# Patient Record
Sex: Female | Born: 1965 | Race: White | Hispanic: No | Marital: Married | Smoking: Never smoker
Health system: Southern US, Community
[De-identification: ages and names within clinical notes are randomized; demographics above are authoritative.]

## PROBLEM LIST (undated history)

## (undated) DIAGNOSIS — F32A Depression, unspecified: Secondary | ICD-10-CM

## (undated) DIAGNOSIS — Z9884 Bariatric surgery status: Secondary | ICD-10-CM

## (undated) DIAGNOSIS — Z87442 Personal history of urinary calculi: Secondary | ICD-10-CM

## (undated) DIAGNOSIS — D649 Anemia, unspecified: Secondary | ICD-10-CM

## (undated) DIAGNOSIS — K219 Gastro-esophageal reflux disease without esophagitis: Secondary | ICD-10-CM

## (undated) DIAGNOSIS — G4730 Sleep apnea, unspecified: Secondary | ICD-10-CM

## (undated) DIAGNOSIS — E119 Type 2 diabetes mellitus without complications: Secondary | ICD-10-CM

## (undated) HISTORY — PX: CARDIAC CATHETERIZATION: SHX172

## (undated) HISTORY — PX: WISDOM TOOTH EXTRACTION: SHX21

## (undated) HISTORY — PX: LIVER BIOPSY: SHX301

## (undated) HISTORY — PX: GASTRIC BYPASS: SHX52

## (undated) HISTORY — PX: BREAST BIOPSY: SHX20

## (undated) HISTORY — PX: CHOLECYSTECTOMY: SHX55

## (undated) HISTORY — DX: Sleep apnea, unspecified: G47.30

---

## 2000-08-07 ENCOUNTER — Other Ambulatory Visit: Admission: RE | Admit: 2000-08-07 | Discharge: 2000-08-07 | Payer: Self-pay | Admitting: Family Medicine

## 2003-05-20 ENCOUNTER — Ambulatory Visit (HOSPITAL_COMMUNITY): Admission: RE | Admit: 2003-05-20 | Discharge: 2003-05-20 | Payer: Self-pay | Admitting: Family Medicine

## 2004-02-04 ENCOUNTER — Other Ambulatory Visit: Admission: RE | Admit: 2004-02-04 | Discharge: 2004-02-04 | Payer: Self-pay | Admitting: Family Medicine

## 2005-03-30 ENCOUNTER — Other Ambulatory Visit: Admission: RE | Admit: 2005-03-30 | Discharge: 2005-03-30 | Payer: Self-pay | Admitting: Family Medicine

## 2007-04-11 ENCOUNTER — Ambulatory Visit (HOSPITAL_BASED_OUTPATIENT_CLINIC_OR_DEPARTMENT_OTHER): Admission: RE | Admit: 2007-04-11 | Discharge: 2007-04-11 | Payer: Self-pay | Admitting: Physician Assistant

## 2007-04-15 ENCOUNTER — Ambulatory Visit: Payer: Self-pay | Admitting: Internal Medicine

## 2007-06-04 ENCOUNTER — Ambulatory Visit (HOSPITAL_BASED_OUTPATIENT_CLINIC_OR_DEPARTMENT_OTHER): Admission: RE | Admit: 2007-06-04 | Discharge: 2007-06-04 | Payer: Self-pay | Admitting: Physician Assistant

## 2007-06-08 ENCOUNTER — Ambulatory Visit: Payer: Self-pay | Admitting: Internal Medicine

## 2009-04-25 ENCOUNTER — Emergency Department (HOSPITAL_COMMUNITY): Admission: EM | Admit: 2009-04-25 | Discharge: 2009-04-25 | Payer: Self-pay | Admitting: Emergency Medicine

## 2010-10-19 LAB — URINE CULTURE: Colony Count: 45000

## 2010-10-19 LAB — COMPREHENSIVE METABOLIC PANEL
ALT: 31 U/L (ref 0–35)
AST: 33 U/L (ref 0–37)
Albumin: 3.3 g/dL — ABNORMAL LOW (ref 3.5–5.2)
Alkaline Phosphatase: 89 U/L (ref 39–117)
BUN: 10 mg/dL (ref 6–23)
CO2: 20 mEq/L (ref 19–32)
Calcium: 8.7 mg/dL (ref 8.4–10.5)
Chloride: 105 mEq/L (ref 96–112)
Creatinine, Ser: 0.83 mg/dL (ref 0.4–1.2)
GFR calc Af Amer: >60 mL/min (ref >60)
GFR calc non Af Amer: >60 mL/min (ref >60)
Glucose, Bld: 169 mg/dL — ABNORMAL HIGH (ref 70–99)
Potassium: 3.1 mEq/L — ABNORMAL LOW (ref 3.5–5.1)
Sodium: 138 mEq/L (ref 135–145)
Total Bilirubin: 0.9 mg/dL (ref 0.3–1.2)
Total Protein: 6.9 g/dL (ref 6.0–8.3)

## 2010-10-19 LAB — URINALYSIS, ROUTINE W REFLEX MICROSCOPIC
Bilirubin Urine: NEGATIVE
Glucose, UA: 100 mg/dL — AB
Ketones, ur: >80 mg/dL — AB
Leukocytes, UA: NEGATIVE
Nitrite: NEGATIVE
Protein, ur: 30 mg/dL — AB
Specific Gravity, Urine: 1.031 — ABNORMAL HIGH (ref 1.005–1.030)
Urobilinogen, UA: 1 mg/dL (ref 0.0–1.0)
pH: 6 (ref 5.0–8.0)

## 2010-10-19 LAB — LIPASE, BLOOD: Lipase: 45 U/L (ref 11–59)

## 2010-10-19 LAB — URINE MICROSCOPIC-ADD ON

## 2010-10-19 LAB — DIFFERENTIAL
Basophils Absolute: 0 10*3/uL (ref 0.0–0.1)
Basophils Relative: 0 % (ref 0–1)
Eosinophils Absolute: 0 10*3/uL (ref 0.0–0.7)
Eosinophils Relative: 1 % (ref 0–5)
Lymphocytes Relative: 15 % (ref 12–46)
Lymphs Abs: 1 10*3/uL (ref 0.7–4.0)
Monocytes Absolute: 0.4 10*3/uL (ref 0.1–1.0)
Monocytes Relative: 6 % (ref 3–12)
Neutro Abs: 5.1 10*3/uL (ref 1.7–7.7)
Neutrophils Relative %: 79 % — ABNORMAL HIGH (ref 43–77)

## 2010-10-19 LAB — CBC
HCT: 36.5 % (ref 36.0–46.0)
Hemoglobin: 12.4 g/dL (ref 12.0–15.0)
MCHC: 34.1 g/dL (ref 30.0–36.0)
MCV: 86.9 fL (ref 78.0–100.0)
Platelets: 130 10*3/uL — ABNORMAL LOW (ref 150–400)
RBC: 4.2 MIL/uL (ref 3.87–5.11)
RDW: 14.8 % (ref 11.5–15.5)
WBC: 6.5 10*3/uL (ref 4.0–10.5)

## 2010-11-28 NOTE — Procedures (Signed)
NAME:  Jessica Bridges, Jessica Bridges                    ACCOUNT NO.:  [id number]   MEDICAL RECORD NO.:  [id number]          PATIENT TYPE:  OUT   LOCATION:  SLEEP CENTER                 FACILITY:  [facility]   PHYSICIAN:  Clinton D. Maple Hudson, MD, FCCP, FACPDATE OF BIRTH:  04/27/1967   DATE OF STUDY:  04/11/2007                            NOCTURNAL POLYSOMNOGRAM   REFERRING PHYSICIAN:   REFERRING PHYSICIAN:  Dr. Lindaann Pascal.   INDICATION FOR STUDY:  Insomnia with sleep apnea.   EPWORTH SLEEPINESS SCORE:  12/24.  BMI 26.5.  Weight 230 pounds.   MEDICATIONS:  Home medications are listed and reviewed.   SLEEP ARCHITECTURE:  Total sleep time 343 minutes with sleep efficiency  87%.  Stage I was 6%.  Stage II 69%.  Stage III 10%.  REM 15% of total  sleep time.  Sleep latency 24 minutes.  REM latency 154 minutes.  Awake  after sleep onset 29 minutes.  Arousal index 18.2.  No bedtime  medication was reported.   RESPIRATORY DATA:  Apnea-hypopnea index (AHI/RDI) 19.2 obstructive  events per hour, indicating moderate obstructive sleep apnea/hypopnea  syndrome.  This included one central apnea, eight obstructive apneas,  and 101 hypopneas.  Events were somewhat more common while supine but  seen in all sleep positions.  REM AHI 58.3.   OXYGEN DATA:  Moderately loud snoring with oxygen desaturation.  Nadir  85%.  Mean oxygen saturation through the study 97% on room air.   CARDIAC DATA:  Normal sinus rhythm.   MOVEMENT-PARASOMNIA:  No significant movement disturbance.  No bathroom  trips.   IMPRESSIONS-RECOMMENDATIONS:  1. Apnea-hypopnea index (AHI/RDI) 19.2 obstructive events per hour,      indicating moderate obstructive sleep apnea/hypopnea syndrome.  The      events were not significantly positional.  Moderately loud snoring      with      oxygen desaturation nadir 85% on room air.  2. Consider return for CPAP titration or evaluate for alternative      therapies as appropriate.      Clinton D. Maple Hudson, MD,  FCCP, FACP  Diplomate, Biomedical engineer of Sleep Medicine  Electronically Signed     CDY/MEDQ  D:  04/15/2007 18:55:15  T:  04/16/2007 10:39:35  Job:  [id number]

## 2010-11-28 NOTE — Procedures (Signed)
NAME:  Lawlor, Natajah                    ACCOUNT NO.:  [id number]   MEDICAL RECORD NO.:  [id number]          PATIENT TYPE:  OUT   LOCATION:  SLEEP CENTER                 FACILITY:  [facility]   PHYSICIAN:  Clinton D. Maple Hudson, MD, FCCP, FACPDATE OF BIRTH:  13[birth year]Sep-1966   DATE OF STUDY:  06/04/2007                            NOCTURNAL POLYSOMNOGRAM   REFERRING PHYSICIAN:  Scott Long, Dr.   [place] FOR STUDY:  Hypersomnia with sleep apnea.   EPWORTH SLEEPINESS SCORE:  12/24.  BMI 36.6.  Weight 227 pounds, 66  inches.   HOME MEDICATIONS:  Listed and reviewed.   SLEEP ARCHITECTURE:  Total sleep time 377 minutes with sleep efficiency  93%.  Stage I was 2%, stage II 83%, stage III 9%, REM reduced at 5.2%.  Sleep latency 1.5 minutes.  REM latency 368 minutes.  Awake after sleep  onset 26 minutes.  Arousal index 9.2.   BEDTIME MEDICATION:  Included bupropion, cetirizine, glipizide, Actos,  hormones, clonazepam.   RESPIRATORY DATA:  CPAP titration protocol.  CPAP was titrated to 8-CWP,  AHI 0 per hour.  A small Mirage Quattro mask was used with heated  humidifier.   OXYGEN DATA:  Oxygen desaturated to 85% with moderate snoring., after  CPAP saturation held 96% on room air.   CARDIAC DATA:  Normal sinus rhythm.   MOVEMENT-PARASOMNIA:  No significant movement disturbance.   IMPRESSIONS-RECOMMENDATIONS:  1. Successful CPAP titration to 8-CWP.  Apnea/hypopnea index zero per      hour.  A small Mirage Quattro mask was used with heated humidifier.  2. Baseline diagnostic nocturnal polysomnogram on April 11, 2007,      had recorded an apnea/hypopnea index of 19.2.      Clinton D. Maple Hudson, MD, [facility], FACP  Diplomate, Biomedical engineer of Sleep Medicine  Electronically Signed     CDY/MEDQ  D:  06/08/2007 14:07:02  T:  06/08/2007 19:43:26  Job:  [id number]

## 2012-05-22 ENCOUNTER — Emergency Department (HOSPITAL_COMMUNITY)

## 2012-05-22 ENCOUNTER — Encounter (HOSPITAL_COMMUNITY): Payer: Self-pay | Admitting: Emergency Medicine

## 2012-05-22 ENCOUNTER — Emergency Department (HOSPITAL_COMMUNITY)
Admission: EM | Admit: 2012-05-22 | Discharge: 2012-05-22 | Disposition: A | Discharge status: Home / Self Care | Attending: Emergency Medicine | Admitting: Emergency Medicine

## 2012-05-22 DIAGNOSIS — M25519 Pain in unspecified shoulder: Secondary | ICD-10-CM

## 2012-05-22 DIAGNOSIS — E119 Type 2 diabetes mellitus without complications: Secondary | ICD-10-CM | POA: Insufficient documentation

## 2012-05-22 DIAGNOSIS — R0602 Shortness of breath: Secondary | ICD-10-CM | POA: Insufficient documentation

## 2012-05-22 HISTORY — DX: Type 2 diabetes mellitus without complications: E11.9

## 2012-05-22 LAB — GLUCOSE, CAPILLARY
Glucose-Capillary: 67 mg/dL — ABNORMAL LOW (ref 70–99)
Glucose-Capillary: 106 mg/dL — ABNORMAL HIGH (ref 70–99)
Glucose-Capillary: 107 mg/dL — ABNORMAL HIGH (ref 70–99)

## 2012-05-22 LAB — CBC WITH DIFFERENTIAL/PLATELET
Basophils Absolute: 0 10*3/uL (ref 0.0–0.1)
Basophils Relative: 0 % (ref 0–1)
Eosinophils Absolute: 0.1 10*3/uL (ref 0.0–0.7)
Eosinophils Relative: 1 % (ref 0–5)
HCT: 34.8 % — ABNORMAL LOW (ref 36.0–46.0)
Hemoglobin: 11.5 g/dL — ABNORMAL LOW (ref 12.0–15.0)
Lymphocytes Relative: 41 % (ref 12–46)
Lymphs Abs: 3.2 10*3/uL (ref 0.7–4.0)
MCH: 26.3 pg (ref 26.0–34.0)
MCHC: 33 g/dL (ref 30.0–36.0)
MCV: 79.5 fL (ref 78.0–100.0)
Monocytes Absolute: 0.5 10*3/uL (ref 0.1–1.0)
Monocytes Relative: 6 % (ref 3–12)
Neutro Abs: 4.1 10*3/uL (ref 1.7–7.7)
Neutrophils Relative %: 52 % (ref 43–77)
Platelets: 263 10*3/uL (ref 150–400)
RBC: 4.38 MIL/uL (ref 3.87–5.11)
RDW: 13.4 % (ref 11.5–15.5)
WBC: 7.9 10*3/uL (ref 4.0–10.5)

## 2012-05-22 LAB — BASIC METABOLIC PANEL
BUN: 11 mg/dL (ref 6–23)
CO2: 26 mEq/L (ref 19–32)
Calcium: 9.6 mg/dL (ref 8.4–10.5)
Chloride: 99 mEq/L (ref 96–112)
Creatinine, Ser: 0.62 mg/dL (ref 0.50–1.10)
GFR calc Af Amer: >90 mL/min (ref >90)
GFR calc non Af Amer: >90 mL/min (ref >90)
Glucose, Bld: 94 mg/dL (ref 70–99)
Potassium: 3.6 mEq/L (ref 3.5–5.1)
Sodium: 135 mEq/L (ref 135–145)

## 2012-05-22 LAB — POCT I-STAT TROPONIN I
Troponin i, poc: 0 ng/mL (ref 0.00–0.08)
Troponin i, poc: 0.02 ng/mL (ref 0.00–0.08)

## 2012-05-22 NOTE — ED Notes (Signed)
Pt c/o left shoulder pain with radiation down left arm and nausea and SOB when working out today

## 2012-05-22 NOTE — ED Provider Notes (Signed)
History     CSN: [id number]  Arrival date & time 05/22/12  1347   First MD Initiated Contact with Patient 05/22/12 1633      Chief Complaint  Patient presents with  . Shoulder Pain  . Shortness of Breath     Patient is a 46 y.o. female presenting with shoulder pain. The history is provided by the patient.  Shoulder Pain This is a new problem. The current episode started yesterday. Episode frequency: intermittently. The problem has been resolved. Associated symptoms include shortness of breath. Pertinent negatives include no chest pain and no abdominal pain. Nothing aggravates the symptoms. Nothing relieves the symptoms. She has tried ASA for the symptoms.  Pt reports she had left shoulder pain yesterday It resolved and she went to bed Today the pain returned and radiated into her left hand with some numbness in her fingers.  This has since resolved. No focal weakness is reported  No anterior CP is reported No back pain She reports she felt mild SOB that has resolved No vomiting She did work out today, but not more than usual for her.  She reports to me that pain was not significant worse with exercise.  No syncope.  She is very active and does not usually have pain with working out  No h/o CAD/PE/DVT No fever/vomiting/diarrhea/abdominal pain No severe HA is reported    Past Medical History  Diagnosis Date  . Diabetes mellitus without complication     History reviewed. No pertinent past surgical history.  Family History - positive for CAD in multiple family members  History  Substance Use Topics  . Smoking status: Never Smoker   . Smokeless tobacco: Not on file  . Alcohol Use: No    OB History    Grav Para Term Preterm Abortions TAB SAB Ect Mult Living                  Review of Systems  Constitutional: Negative for fever.  Respiratory: Positive for shortness of breath.   Cardiovascular: Negative for chest pain.  Gastrointestinal: Negative for vomiting and  abdominal pain.  Neurological: Negative for syncope.  All other systems reviewed and are negative.    Allergies  Review of patient's allergies indicates no known allergies.  Home Medications  No current outpatient prescriptions on file.  BP 122/72  Pulse 69  Temp 98.1 F (36.7 C) (Oral)  Resp 14  SpO2 100%  Physical Exam CONSTITUTIONAL: Well developed/well nourished HEAD AND FACE: Normocephalic/atraumatic EYES: EOMI/PERRL ENMT: Mucous membranes moist NECK: supple no meningeal signs SPINE:entire spine nontender CV: S1/S2 noted, no murmurs/rubs/gallops noted LUNGS: Lungs are clear to auscultation bilaterally, no apparent distress ABDOMEN: soft, nontender, no rebound or guarding GU:no cva tenderness NEURO: Pt is awake/alert, moves all extremitiesx4 EXTREMITIES: pulses normal, full ROM. No tenderness to palpation of left shoulder, no deformity is noted.  Full rom of left shoulder and no erythema. No edema to her arms/legs.  No calf tenderness SKIN: warm, color normal PSYCH: no abnormalities of mood noted  ED Course  Procedures   Labs Reviewed  CBC WITH DIFFERENTIAL - Abnormal; Notable for the following:    Hemoglobin 11.5 (*)     HCT 34.8 (*)     All other components within normal limits  GLUCOSE, CAPILLARY - Abnormal; Notable for the following:    Glucose-Capillary 67 (*)     All other components within normal limits  BASIC METABOLIC PANEL  POCT I-STAT TROPONIN I   Dg Chest 2  View  05/22/2012  *RADIOLOGY REPORT*  Clinical Data: Pain and shortness of breath  CHEST - 2 VIEW  Comparison: None.  Findings: Lungs clear.  Heart size and pulmonary vascularity are normal.  No adenopathy.  No bone lesions.  IMPRESSION: Lungs clear.   Original Report Authenticated By: Bretta Bang, M.D.    5:54 PM Pt with atypical pain, now pain free Initial troponin negative Will repeat trop with EKG If unchanged stable for d/c ?cervical radiculopathy but denied any significant neck  pain 8:04 PM Pt stable, no new symptoms, repeat troponin negative Discussed strict return precautions Stable for d/c   MDM  Nursing notes including past medical history and social history reviewed and considered in documentation xrays reviewed and considered Labs/vital reviewed and considered        Date: 05/22/2012 1404  Rate: 73  Rhythm: normal sinus rhythm  QRS Axis: normal  Intervals: normal  ST/T Wave abnormalities: normal  Conduction Disutrbances:none  Narrative Interpretation:   Old EKG Reviewed: EKG from her workplace reviewed and negative   Date: 05/22/2012 1758  Rate: 65  Rhythm: normal sinus rhythm  QRS Axis: normal  Intervals: normal  ST/T Wave abnormalities: normal  Conduction Disutrbances:none  Narrative Interpretation:   Old EKG Reviewed: unchanged    Joya Gaskins, MD 05/22/12 2004

## 2012-09-24 ENCOUNTER — Other Ambulatory Visit (HOSPITAL_COMMUNITY): Payer: Self-pay | Admitting: Family Medicine

## 2012-09-24 DIAGNOSIS — R002 Palpitations: Secondary | ICD-10-CM

## 2012-09-30 ENCOUNTER — Ambulatory Visit (HOSPITAL_COMMUNITY)
Admission: RE | Admit: 2012-09-30 | Discharge: 2012-09-30 | Disposition: A | Discharge status: Home / Self Care | Source: Ambulatory Visit | Attending: Family Medicine | Admitting: Family Medicine

## 2012-09-30 DIAGNOSIS — R002 Palpitations: Secondary | ICD-10-CM | POA: Insufficient documentation

## 2012-09-30 DIAGNOSIS — E119 Type 2 diabetes mellitus without complications: Secondary | ICD-10-CM | POA: Insufficient documentation

## 2012-09-30 DIAGNOSIS — I059 Rheumatic mitral valve disease, unspecified: Secondary | ICD-10-CM | POA: Insufficient documentation

## 2012-09-30 NOTE — Progress Notes (Signed)
  Echocardiogram 2D Echocardiogram has been performed.  Sebert Stollings FRANCES 09/30/2012, 10:20 AM

## 2012-10-08 ENCOUNTER — Ambulatory Visit (HOSPITAL_COMMUNITY)

## 2012-10-15 ENCOUNTER — Institutional Professional Consult (permissible substitution): Admitting: Cardiology

## 2012-10-22 DIAGNOSIS — R002 Palpitations: Secondary | ICD-10-CM

## 2012-11-04 ENCOUNTER — Ambulatory Visit: Admitting: Cardiology

## 2013-03-26 ENCOUNTER — Other Ambulatory Visit (HOSPITAL_COMMUNITY): Payer: Self-pay | Admitting: Obstetrics and Gynecology

## 2013-03-26 DIAGNOSIS — Z1231 Encounter for screening mammogram for malignant neoplasm of breast: Secondary | ICD-10-CM

## 2013-04-07 ENCOUNTER — Ambulatory Visit (HOSPITAL_COMMUNITY)
Admission: RE | Admit: 2013-04-07 | Discharge: 2013-04-07 | Disposition: A | Discharge status: Home / Self Care | Source: Ambulatory Visit | Attending: Obstetrics and Gynecology | Admitting: Obstetrics and Gynecology

## 2013-04-07 DIAGNOSIS — Z1231 Encounter for screening mammogram for malignant neoplasm of breast: Secondary | ICD-10-CM

## 2013-06-12 ENCOUNTER — Emergency Department (HOSPITAL_COMMUNITY)

## 2013-06-12 ENCOUNTER — Encounter (HOSPITAL_COMMUNITY): Payer: Self-pay | Admitting: Emergency Medicine

## 2013-06-12 ENCOUNTER — Emergency Department (HOSPITAL_COMMUNITY)
Admission: EM | Admit: 2013-06-12 | Discharge: 2013-06-12 | Disposition: A | Discharge status: Home / Self Care | Attending: Emergency Medicine | Admitting: Emergency Medicine

## 2013-06-12 DIAGNOSIS — Z3202 Encounter for pregnancy test, result negative: Secondary | ICD-10-CM | POA: Insufficient documentation

## 2013-06-12 DIAGNOSIS — Z79899 Other long term (current) drug therapy: Secondary | ICD-10-CM | POA: Insufficient documentation

## 2013-06-12 DIAGNOSIS — E119 Type 2 diabetes mellitus without complications: Secondary | ICD-10-CM | POA: Insufficient documentation

## 2013-06-12 DIAGNOSIS — N12 Tubulo-interstitial nephritis, not specified as acute or chronic: Secondary | ICD-10-CM

## 2013-06-12 DIAGNOSIS — Z87442 Personal history of urinary calculi: Secondary | ICD-10-CM | POA: Insufficient documentation

## 2013-06-12 DIAGNOSIS — Z792 Long term (current) use of antibiotics: Secondary | ICD-10-CM | POA: Insufficient documentation

## 2013-06-12 DIAGNOSIS — R3915 Urgency of urination: Secondary | ICD-10-CM | POA: Insufficient documentation

## 2013-06-12 LAB — PREGNANCY, URINE: Preg Test, Ur: NEGATIVE

## 2013-06-12 LAB — URINE MICROSCOPIC-ADD ON

## 2013-06-12 LAB — POCT I-STAT, CHEM 8
Calcium, Ion: 1.19 mmol/L (ref 1.12–1.23)
HCT: 31 % — ABNORMAL LOW (ref 36.0–46.0)
Hemoglobin: 10.5 g/dL — ABNORMAL LOW (ref 12.0–15.0)
Sodium: 136 mEq/L (ref 135–145)
TCO2: 21 mmol/L (ref 0–100)

## 2013-06-12 LAB — URINALYSIS, ROUTINE W REFLEX MICROSCOPIC
Protein, ur: 30 mg/dL — AB
Specific Gravity, Urine: 1.015 (ref 1.005–1.030)
Urobilinogen, UA: 0.2 mg/dL (ref 0.0–1.0)

## 2013-06-12 MED ORDER — DEXTROSE 5 % IV SOLN
1.0000 g | Freq: Once | INTRAVENOUS | Status: AC
  Administered 2013-06-12: 1 g via INTRAVENOUS
  Filled 2013-06-12: qty 10

## 2013-06-12 MED ORDER — CEPHALEXIN 500 MG PO CAPS: 500.0000 mg | ORAL_CAPSULE | Freq: Four times a day (QID) | ORAL | Status: DC

## 2013-06-12 MED ORDER — FLUCONAZOLE 150 MG PO TABS: 150.0000 mg | ORAL_TABLET | Freq: Once | ORAL | Status: DC

## 2013-06-12 MED ORDER — HYDROMORPHONE HCL PF 1 MG/ML IJ SOLN
1.0000 mg | Freq: Once | INTRAMUSCULAR | Status: AC
  Administered 2013-06-12: 1 mg via INTRAVENOUS
  Filled 2013-06-12: qty 1

## 2013-06-12 MED ORDER — OXYCODONE-ACETAMINOPHEN 5-325 MG PO TABS
2.0000 | ORAL_TABLET | Freq: Once | ORAL | Status: AC
  Administered 2013-06-12: 2 via ORAL
  Filled 2013-06-12: qty 2

## 2013-06-12 MED ORDER — SODIUM CHLORIDE 0.9 % IV BOLUS (SEPSIS)
1000.0000 mL | Freq: Once | INTRAVENOUS | Status: AC
  Administered 2013-06-12: 1000 mL via INTRAVENOUS

## 2013-06-12 MED ORDER — ONDANSETRON HCL 4 MG/2ML IJ SOLN
4.0000 mg | INTRAMUSCULAR | Status: AC
  Administered 2013-06-12: 4 mg via INTRAVENOUS
  Filled 2013-06-12: qty 2

## 2013-06-12 MED ORDER — OXYCODONE-ACETAMINOPHEN 5-325 MG PO TABS: 1.0000 | ORAL_TABLET | Freq: Four times a day (QID) | ORAL | Status: DC | PRN

## 2013-06-12 NOTE — ED Notes (Signed)
Patient has ride home with husband 

## 2013-06-12 NOTE — ED Notes (Signed)
Patient began having left flank pain yesterday. Patient has hx of kidney stones and states pain feels similar. Patient had urinary urgency yesterday, but none today. Denies n/v. Pateint has taken otc AZO

## 2013-06-12 NOTE — ED Provider Notes (Signed)
CSN: [id number]     Arrival date & time 06/12/13  0012 History   First MD Initiated Contact with Patient 06/12/13 0014     Chief Complaint  Patient presents with  . Flank Pain   (Consider location/radiation/quality/duration/timing/severity/associated sxs/prior Treatment) HPI Comments: Patient is a 47 year old female with a history of kidney stones who presents for left flank pain with onset at 10 PM last night. Patient states the pain is sharp it has been constant since onset as well as waxing and waning in severity. Patient denies any aggravating or alleviating factors of her symptoms. She endorses associated urinary urgency yesterday as well as intermittent nausea. She denies associated fever, chest pain or shortness of breath, abdominal pain, vomiting, diarrhea, hematuria or dysuria, vaginal complaints, numbness or tingling, and weakness.  Patient is a 47 y.o. female presenting with flank pain. The history is provided by the patient. No language interpreter was used.  Flank Pain Associated symptoms include nausea. Pertinent negatives include no abdominal pain, chest pain, fever, numbness, vomiting or weakness.    Past Medical History  Diagnosis Date  . Diabetes mellitus without complication    History reviewed. No pertinent past surgical history. No family history on file. History  Substance Use Topics  . Smoking status: Never Smoker   . Smokeless tobacco: Not on file  . Alcohol Use: No   OB History   Grav Para Term Preterm Abortions TAB SAB Ect Mult Living                 Review of Systems  Constitutional: Negative for fever.  Respiratory: Negative for shortness of breath.   Cardiovascular: Negative for chest pain.  Gastrointestinal: Positive for nausea. Negative for vomiting, abdominal pain and diarrhea.  Genitourinary: Positive for urgency and flank pain.  Neurological: Negative for weakness and numbness.  All other systems reviewed and are negative.    Allergies   Review of patient's allergies indicates no known allergies.  Home Medications   Current Outpatient Rx  Name  Route  Sig  Dispense  Refill  . acetaminophen (TYLENOL) 500 MG tablet   Oral   Take 1,000 mg by mouth every 6 (six) hours as needed for mild pain or moderate pain.         Marland Kitchen buPROPion (ZYBAN) 150 MG 12 hr tablet   Oral   Take 150 mg by mouth daily.          . cholecalciferol (VITAMIN D) 1000 UNITS tablet   Oral   Take 1,000 Units by mouth daily. Patient uses 5000 iu.         . escitalopram (LEXAPRO) 10 MG tablet   Oral   Take 10 mg by mouth daily.         Marland Kitchen glipiZIDE (GLUCOTROL XL) 5 MG 24 hr tablet   Oral   Take 5 mg by mouth daily with breakfast.         . Iron TABS   Oral   Take 22 mg by mouth daily.         Marland Kitchen levocetirizine (XYZAL) 5 MG tablet   Oral   Take 5 mg by mouth every evening.         . magnesium gluconate (MAGONATE) 500 MG tablet   Oral   Take 500 mg by mouth daily.         . metFORMIN (GLUCOPHAGE-XR) 500 MG 24 hr tablet   Oral   Take 500 mg by mouth daily with breakfast.         .  Multiple Vitamin (MULTIVITAMIN) tablet   Oral   Take 1 tablet by mouth daily.         . norethindrone-ethinyl estradiol (JUNEL FE,GILDESS FE,LOESTRIN FE) 1-20 MG-MCG tablet   Oral   Take 1 tablet by mouth daily.         Marland Kitchen omeprazole (PRILOSEC) 20 MG capsule   Oral   Take 20 mg by mouth daily.         . sitaGLIPtin (JANUVIA) 100 MG tablet   Oral   Take 100 mg by mouth daily.         . vitamin B-12 (CYANOCOBALAMIN) 1000 MCG tablet   Oral   Take 1,000 mcg by mouth daily.         . cephALEXin (KEFLEX) 500 MG capsule   Oral   Take 1 capsule (500 mg total) by mouth 4 (four) times daily.   40 capsule   0   . oxyCODONE-acetaminophen (PERCOCET/ROXICET) 5-325 MG per tablet   Oral   Take 1-2 tablets by mouth every 6 (six) hours as needed for severe pain.   17 tablet   0    BP 135/79  Pulse 76  Temp(Src) 97.7 F (36.5 C)  (Oral)  Resp 17  SpO2 98%  Physical Exam  Nursing note and vitals reviewed. Constitutional: She is oriented to person, place, and time. She appears well-developed and well-nourished. No distress.  HENT:  Head: Normocephalic and atraumatic.  Eyes: Conjunctivae and EOM are normal. No scleral icterus.  Neck: Normal range of motion.  Cardiovascular: Normal rate, regular rhythm and normal heart sounds.   Pulmonary/Chest: Effort normal and breath sounds normal. No respiratory distress. She has no wheezes. She has no rales.  Abdominal: Soft. She exhibits no distension. There is tenderness (L CVA and L mid abdomen along anterior axillary line). There is no rebound and no guarding.  No peritoneal signs or guarding.  Musculoskeletal: Normal range of motion.  Neurological: She is alert and oriented to person, place, and time.  Skin: Skin is warm and dry. No rash noted. She is not diaphoretic. No erythema. No pallor.  Psychiatric: She has a normal mood and affect. Her behavior is normal.    ED Course  Procedures (including critical care time) Labs Review Labs Reviewed  URINALYSIS, ROUTINE W REFLEX MICROSCOPIC - Abnormal; Notable for the following:    APPearance CLOUDY (*)    Hgb urine dipstick MODERATE (*)    Protein, ur 30 (*)    Nitrite POSITIVE (*)    Leukocytes, UA MODERATE (*)    All other components within normal limits  URINE MICROSCOPIC-ADD ON - Abnormal; Notable for the following:    Squamous Epithelial / LPF MANY (*)    Bacteria, UA MANY (*)    All other components within normal limits  POCT I-STAT, CHEM 8 - Abnormal; Notable for the following:    Glucose, Bld 114 (*)    Hemoglobin 10.5 (*)    HCT 31.0 (*)    All other components within normal limits  URINE CULTURE  PREGNANCY, URINE   Imaging Review Ct Abdomen Pelvis Wo Contrast  06/12/2013   CLINICAL DATA:  Left flank pain. Left-sided abdominal pain. Nausea. History kidney stones.  EXAM: CT ABDOMEN AND PELVIS WITHOUT  CONTRAST  TECHNIQUE: Multidetector CT imaging of the abdomen and pelvis was performed following the standard protocol without intravenous contrast.  COMPARISON:  04/25/2009  FINDINGS: The lung bases are clear.  Postoperative changes consistent with gastric bypass.  The kidneys  appear symmetrical in size and shape. No pyelocaliectasis or ureterectasis. No renal, ureteral, or bladder stones. No bladder wall thickening.  Surgical absence of the gallbladder. The unenhanced appearance of the liver, spleen, pancreas, adrenal glands, abdominal aorta, inferior vena cava, and retroperitoneal lymph nodes is unremarkable. The stomach, small bowel, and colon are not abnormally distended. No free air or free fluid in the abdomen.  Pelvis: The bladder wall is not thickened. Diverticula in the sigmoid colon without evidence of diverticulitis. The appendix is normal. No free or loculated pelvic fluid collections. Degenerative changes in the lumbar spine. No destructive bone lesions appreciated.  IMPRESSION: No renal or ureteral stone or obstruction.   Electronically Signed   By: Burman Nieves M.D.   On: 06/12/2013 02:21    EKG Interpretation   None       MDM   1. Pyelonephritis     Patient is a 47 year old female with a history of kidney stones who presents for left flank pain. Patient well and nontoxic appearing, hemodynamically stable, and afebrile arrival today. Physical exam significant for left flank pain and tenderness to palpation on the left side of patient's abdomen, along anterior axillary line. No peritoneal signs or guarding appreciated.  Urinalysis today significant for signs of urinary tract infection. Kidney function preserved. As patient with history of kidney stones and microscopic hematuria appreciated, will further evaluate with CT scan to evaluate for hemorrhagic cystitis vs pyelonephritis and concomitant ureterolthiasis. IV Rocephin ordered. Pain well controlled with Dilaudid and IVF.  CT  scan without evidence of renal or ureteral stones or obstruction. Suspect symptoms secondary to hemorrhagic cystitis/early pyelonephritis. Patient's pain continues to be well-controlled. She is stable for discharge with prescriptions for Keflex and Percocet for symptomatic management. Diflucan also prescribed as patient endorses frequent yeast infections secondary to antibiotic use. Return precautions discussed with the patient who verbalizes comfort and understanding with this discharge plan with no unaddressed concerns.   Antony Madura, PA-C 06/15/13 1921

## 2013-06-14 LAB — URINE CULTURE

## 2013-06-15 NOTE — ED Notes (Signed)
+   Urine Chart appended per protocol MD. 

## 2013-06-16 NOTE — ED Provider Notes (Signed)
Medical screening examination/treatment/procedure(s) were performed by non-physician practitioner and as supervising physician I was immediately available for consultation/collaboration.  EKG Interpretation   None         Candyce Churn, MD 06/16/13 [id number]

## 2015-01-27 IMAGING — CT CT ABD-PELV W/O CM
2 of 4 series · 17 of 46 positions shown, 19 images · non-contrast
Comparison: 04/25/2009

CLINICAL DATA: Left flank pain. Left-sided abdominal pain. Nausea.
History kidney stones.

EXAM:
CT ABDOMEN AND PELVIS WITHOUT CONTRAST
TECHNIQUE: Multidetector CT imaging of the abdomen and pelvis was performed
following the standard protocol without intravenous contrast.

[Series 2: stone study 5.0 i30f 1 · axial · 0.80mm/px · z∈[+734,+1198]mm · 14 of 103 slices shown, 16 images]
[im 5/103  soft-tissue]
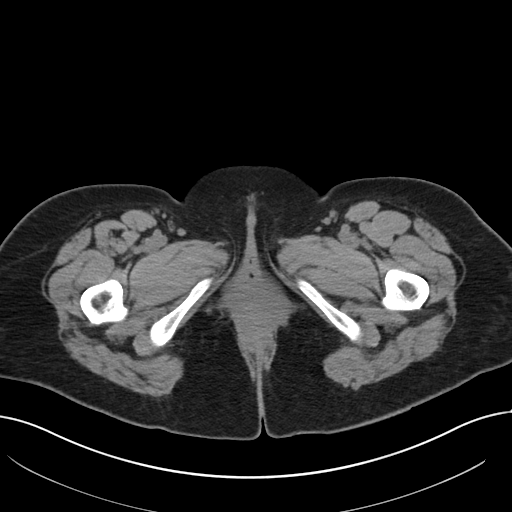
[im 5/103  bone]
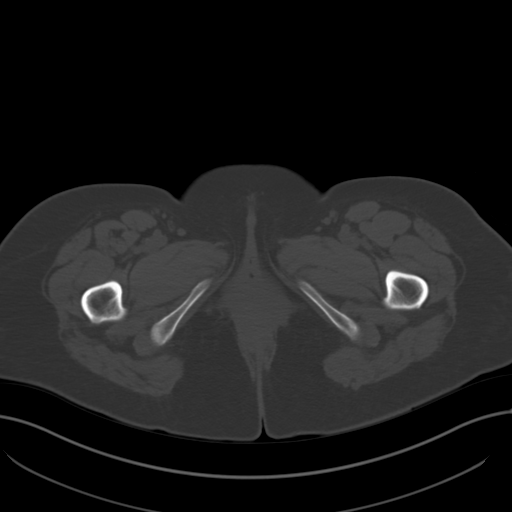
[im 14/103  soft-tissue]
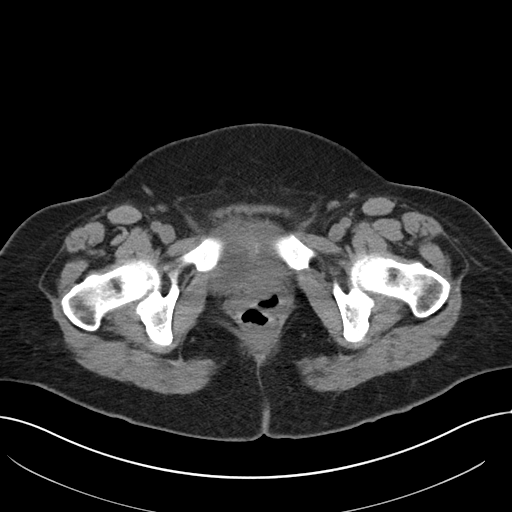
[im 19/103  soft-tissue]
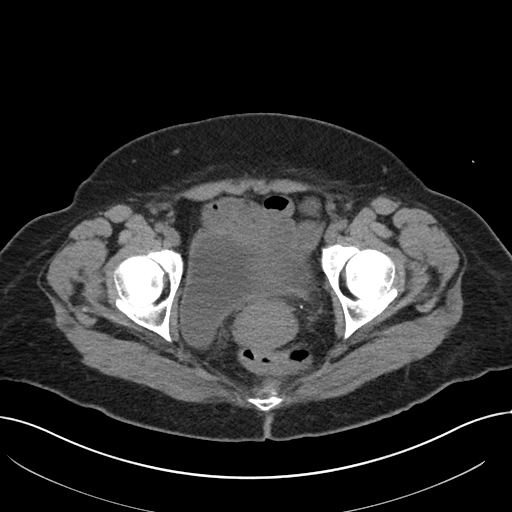
[im 28/103  soft-tissue]
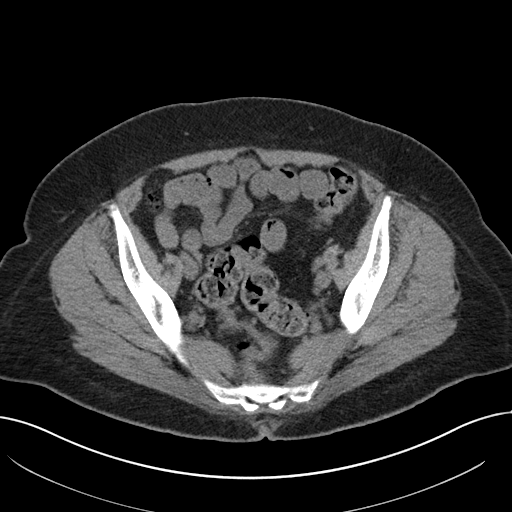
[im 33/103  soft-tissue]
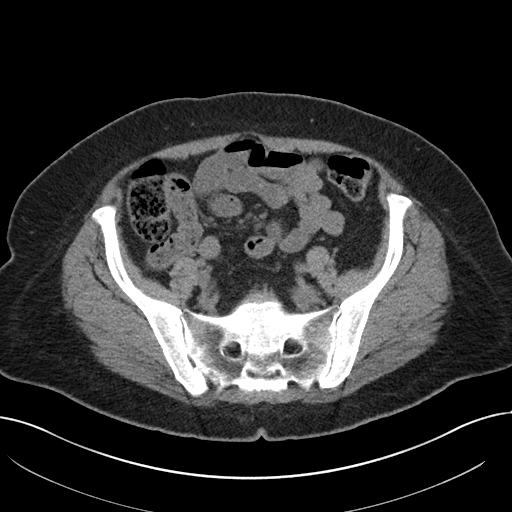
[im 42/103  soft-tissue]
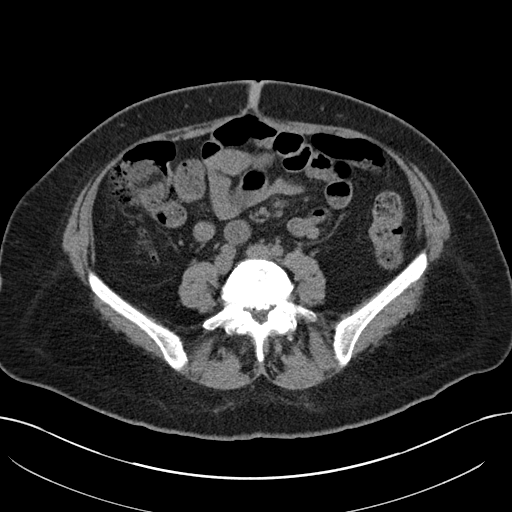
[im 47/103  soft-tissue]
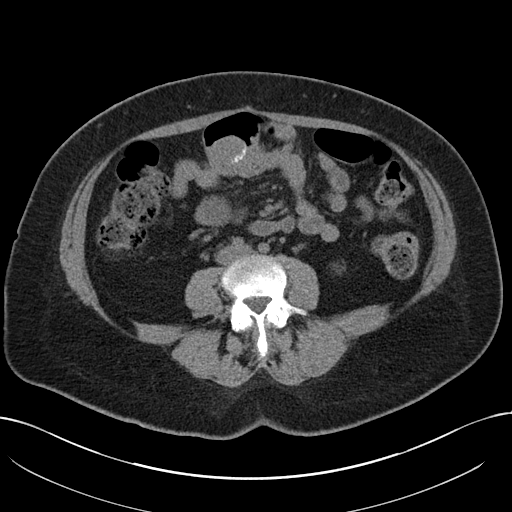
[im 56/103  soft-tissue]
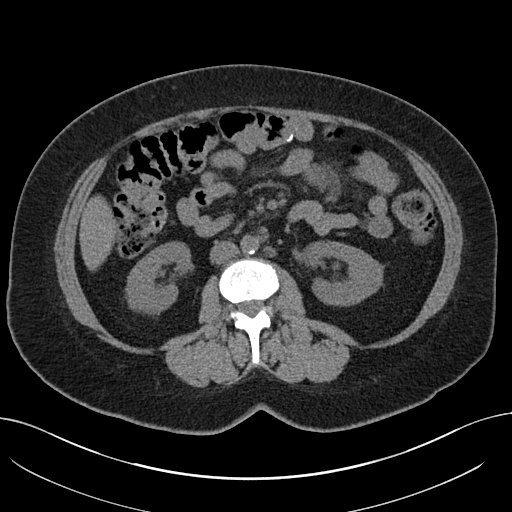
[im 61/103  soft-tissue]
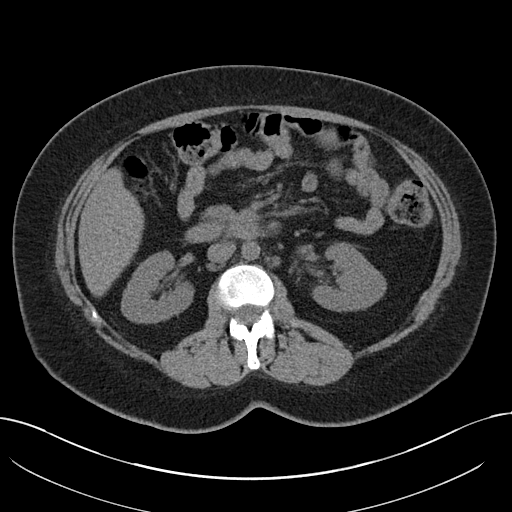
[im 61/103  bone]
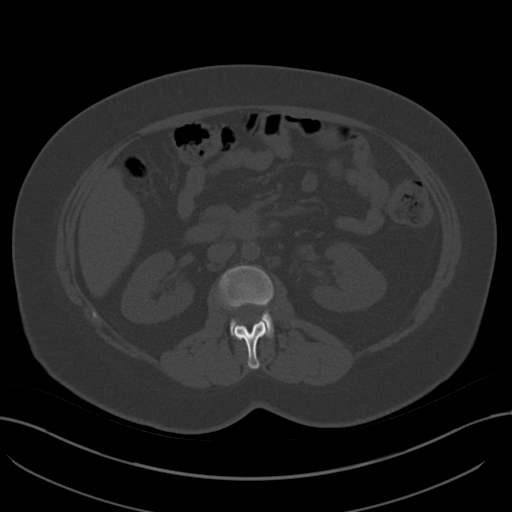
[im 70/103  soft-tissue]
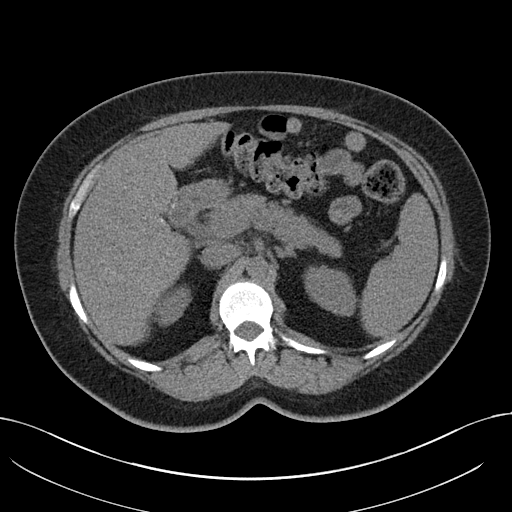
[im 75/103  soft-tissue]
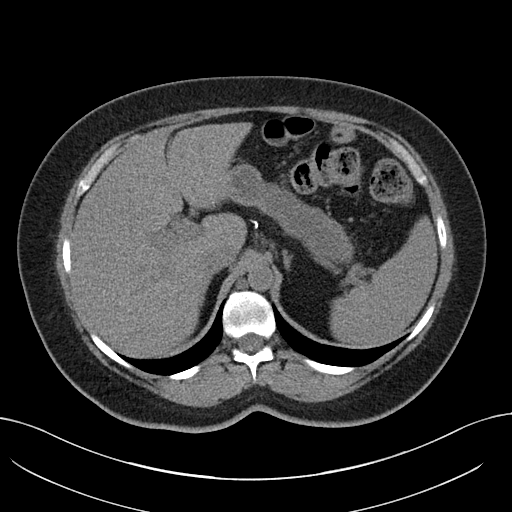
[im 84/103  soft-tissue]
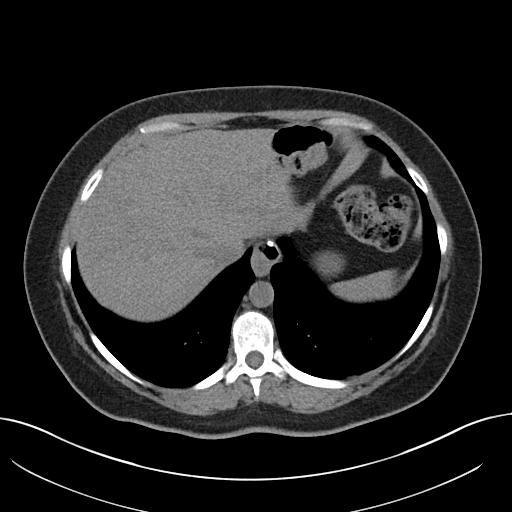
[im 89/103  soft-tissue]
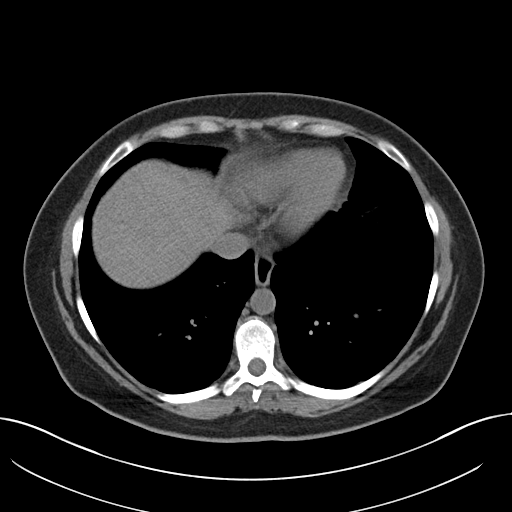
[im 98/103  soft-tissue]
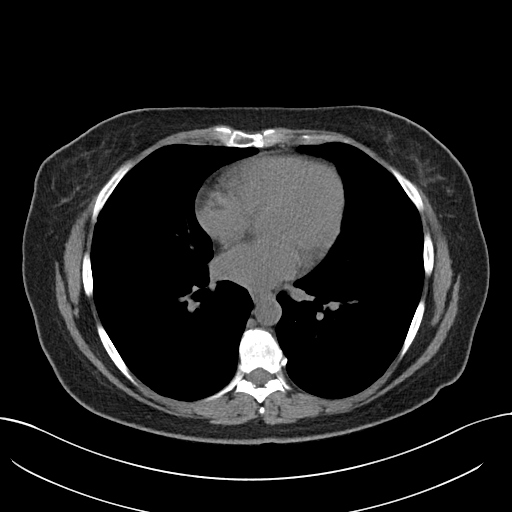

[Series 5: coronal soft tissue · coronal · 0.91mm/px · 3 of 96 slices shown]
[im 32/96  soft-tissue]
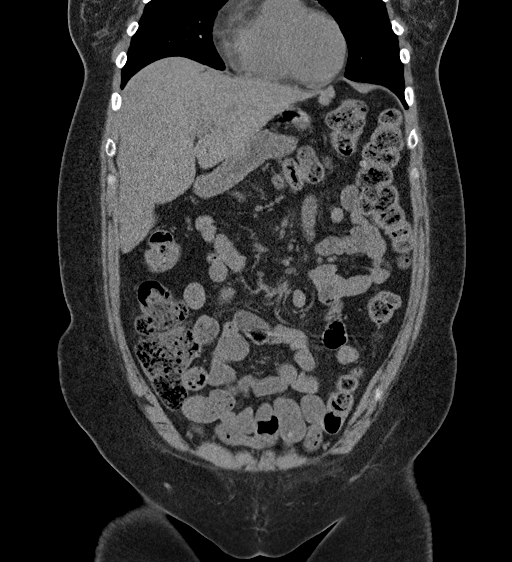
[im 43/96  soft-tissue]
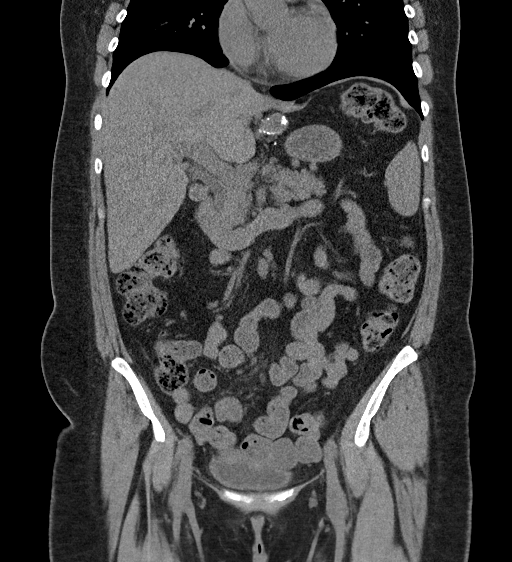
[im 53/96  soft-tissue]
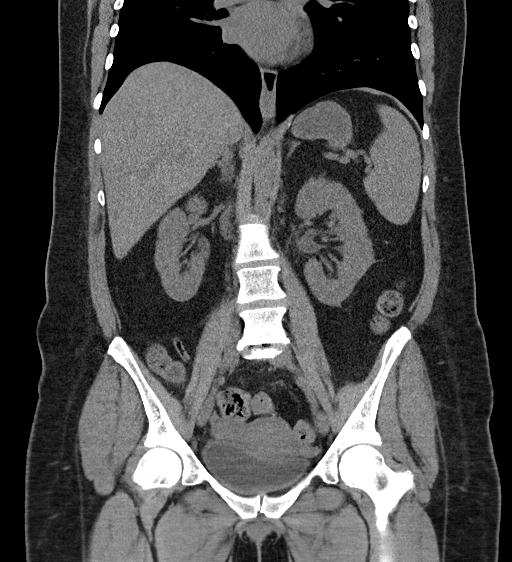

[17 of 46 positions shown; findings below may reference images not displayed]

FINDINGS: The lung bases are clear.

Postoperative changes consistent with gastric bypass.

The kidneys appear symmetrical in size and shape. No
pyelocaliectasis or ureterectasis. No renal, ureteral, or bladder
stones. No bladder wall thickening.

Surgical absence of the gallbladder. The unenhanced appearance of
the liver, spleen, pancreas, adrenal glands, abdominal aorta,
inferior vena cava, and retroperitoneal lymph nodes is unremarkable.
The stomach, small bowel, and colon are not abnormally distended. No
free air or free fluid in the abdomen.

Pelvis: The bladder wall is not thickened. Diverticula in the
sigmoid colon without evidence of diverticulitis. The appendix is
normal. No free or loculated pelvic fluid collections. Degenerative
changes in the lumbar spine. No destructive bone lesions
appreciated.
IMPRESSION: No renal or ureteral stone or obstruction.

## 2016-12-14 HISTORY — PX: DENTAL RESTORATION/EXTRACTION WITH X-RAY: SHX5796

## 2017-03-14 ENCOUNTER — Encounter: Payer: Self-pay | Admitting: Internal Medicine

## 2017-04-25 DIAGNOSIS — E222 Syndrome of inappropriate secretion of antidiuretic hormone: Secondary | ICD-10-CM | POA: Insufficient documentation

## 2017-05-01 ENCOUNTER — Ambulatory Visit (AMBULATORY_SURGERY_CENTER): Payer: Self-pay

## 2017-05-01 VITALS — Ht 65.5 in | Wt 185.6 lb

## 2017-05-01 DIAGNOSIS — E222 Syndrome of inappropriate secretion of antidiuretic hormone: Secondary | ICD-10-CM

## 2017-05-01 DIAGNOSIS — Z1211 Encounter for screening for malignant neoplasm of colon: Secondary | ICD-10-CM

## 2017-05-01 MED ORDER — SUPREP BOWEL PREP KIT 17.5-3.13-1.6 GM/177ML PO SOLN: 1.0000 | Freq: Once | ORAL | 0 refills | Status: AC

## 2017-05-01 NOTE — Progress Notes (Signed)
No allergies to eggs or soy No past problems with anesthesia No diet meds No home oxygen  Registered emmi

## 2017-05-02 ENCOUNTER — Encounter

## 2017-05-16 ENCOUNTER — Encounter: Payer: Self-pay | Admitting: Internal Medicine

## 2017-05-16 ENCOUNTER — Ambulatory Visit (AMBULATORY_SURGERY_CENTER): Admitting: Internal Medicine

## 2017-05-16 VITALS — BP 144/81 | HR 67 | Temp 98.4°F | Resp 13 | Ht 65.0 in | Wt 185.0 lb

## 2017-05-16 DIAGNOSIS — Z1211 Encounter for screening for malignant neoplasm of colon: Secondary | ICD-10-CM

## 2017-05-16 DIAGNOSIS — D123 Benign neoplasm of transverse colon: Secondary | ICD-10-CM

## 2017-05-16 DIAGNOSIS — Z1212 Encounter for screening for malignant neoplasm of rectum: Secondary | ICD-10-CM | POA: Diagnosis not present

## 2017-05-16 MED ORDER — SODIUM CHLORIDE 0.9 % IV SOLN: 500.0000 mL | INTRAVENOUS | Status: DC

## 2017-05-16 NOTE — Progress Notes (Signed)
To recovery, report to RN, VSS. 

## 2017-05-16 NOTE — Op Note (Signed)
[place] Patient Name: Jessica Bridges Procedure Date: 05/16/2017 9:11 AM MRN: [id number] Endoscopist: Jerene Bears , MD Age: 51 Referring MD:  Date of Birth: 1966-05-30 Gender: Female Account #: [id number] Procedure:                Colonoscopy Indications:              Screening for colorectal malignant neoplasm, This                            is the patient's first colonoscopy Medicines:                Monitored Anesthesia Care Procedure:                Pre-Anesthesia Assessment:                           - Prior to the procedure, a History and Physical                            was performed, and patient medications and                            allergies were reviewed. The patient's tolerance of                            previous anesthesia was also reviewed. The risks                            and benefits of the procedure and the sedation                            options and risks were discussed with the patient.                            All questions were answered, and informed consent                            was obtained. Prior Anticoagulants: The patient has                            taken no previous anticoagulant or antiplatelet                            agents. ASA Grade Assessment: II - A patient with                            mild systemic disease. After reviewing the risks                            and benefits, the patient was deemed in                            satisfactory condition to undergo the procedure.  After obtaining informed consent, the colonoscope                            was passed under direct vision. Throughout the                            procedure, the patient's blood pressure, pulse, and                            oxygen saturations were monitored continuously. The                            Model PCF-[id number]) scope was introduced                            through the anus and advanced  to the the cecum,                            identified by appendiceal orifice and ileocecal                            valve. The colonoscopy was performed without                            difficulty. The patient tolerated the procedure                            well. The quality of the bowel preparation was                            good. The ileocecal valve, appendiceal orifice, and                            rectum were photographed. Scope In: 9:16:50 AM Scope Out: 9:31:52 AM Scope Withdrawal Time: 0 hours 11 minutes 52 seconds  Total Procedure Duration: 0 hours 15 minutes 2 seconds  Findings:                 The perianal and digital rectal examinations were                            normal.                           A 5 mm polyp was found in the distal transverse                            colon. The polyp was sessile. The polyp was removed                            with a cold snare. Resection and retrieval were                            complete.  Scattered small-mouthed diverticula were found in                            the sigmoid colon and descending colon.                           The retroflexed view of the distal rectum and anal                            verge was normal and showed no anal or rectal                            abnormalities. Complications:            No immediate complications. Estimated Blood Loss:     Estimated blood loss was minimal. Impression:               - One 5 mm polyp in the distal transverse colon,                            removed with a cold snare. Resected and retrieved.                           - Mild diverticulosis in the sigmoid colon and in                            the descending colon.                           - The distal rectum and anal verge are normal on                            retroflexion view. Recommendation:           - Patient has a contact number available for                             emergencies. The signs and symptoms of potential                            delayed complications were discussed with the                            patient. Return to normal activities tomorrow.                            Written discharge instructions were provided to the                            patient.                           - Resume previous diet.                           - Continue present medications.                           -  Await pathology results.                           - Repeat colonoscopy is recommended. The                            colonoscopy date will be determined after pathology                            results from today's exam become available for                            review. Jerene Bears, MD 05/16/2017 9:34:45 AM This report has been signed electronically.

## 2017-05-16 NOTE — Patient Instructions (Addendum)
YOU HAD AN ENDOSCOPIC PROCEDURE TODAY AT [facility]:   Refer to the procedure report that was given to you for any specific questions about what was found during the examination.  If the procedure report does not answer your questions, please call your gastroenterologist to clarify.  If you requested that your care partner not be given the details of your procedure findings, then the procedure report has been included in a sealed envelope for you to review at your convenience later.  YOU SHOULD EXPECT: Some feelings of bloating in the abdomen. Passage of more gas than usual.  Walking can help get rid of the air that was put into your GI tract during the procedure and reduce the bloating. If you had a lower endoscopy (such as a colonoscopy or flexible sigmoidoscopy) you may notice spotting of blood in your stool or on the toilet paper. If you underwent a bowel prep for your procedure, you may not have a normal bowel movement for a few days.  Please Note:  You might notice some irritation and congestion in your nose or some drainage.  This is from the oxygen used during your procedure.  There is no need for concern and it should clear up in a day or so.  SYMPTOMS TO REPORT IMMEDIATELY:   Following lower endoscopy (colonoscopy or flexible sigmoidoscopy):  Excessive amounts of blood in the stool  Significant tenderness or worsening of abdominal pains  Swelling of the abdomen that is new, acute  Fever of 100F or higher   For urgent or emergent issues, a gastroenterologist can be reached at any hour by calling [phone].   DIET:  We do recommend a small meal at first, but then you may proceed to your regular diet.  Drink plenty of fluids but you should avoid alcoholic beverages for 24 hours.  ACTIVITY:  You should plan to take it easy for the rest of today and you should NOT DRIVE or use heavy machinery until tomorrow (because of the sedation medicines used during the test).     FOLLOW UP: Our staff will call the number listed on your records the next business day following your procedure to check on you and address any questions or concerns that you may have regarding the information given to you following your procedure. If we do not reach you, we will leave a message.  However, if you are feeling well and you are not experiencing any problems, there is no need to return our call.  We will assume that you have returned to your regular daily activities without incident.  If any biopsies were taken you will be contacted by phone or by letter within the next 1-3 weeks.  Please call us at [phone] if you have not heard about the biopsies in 3 weeks.    SIGNATURES/CONFIDENTIALITY: You and/or your care partner have signed paperwork which will be entered into your electronic medical record.  These signatures attest to the fact that that the information above on your After Visit Summary has been reviewed and is understood.  Full responsibility of the confidentiality of this discharge information lies with you and/or your care-partner.    Handouts were given to your care partner on polyps and diverticulosis. Your blood sugar was 220 in the recovery room. You may resume your current medications today. Await biopsy results. Please call if any questions or concerns.

## 2017-05-16 NOTE — Progress Notes (Signed)
Called to room to assist during endoscopic procedure.  Patient ID and intended procedure confirmed with present staff. Received instructions for my participation in the procedure from the performing physician.  

## 2017-05-16 NOTE — Progress Notes (Signed)
No problems noted in the recovery room. maw 

## 2017-05-16 NOTE — Progress Notes (Signed)
Pt's states no medical or surgical changes since previsit or office visit. 

## 2017-05-17 ENCOUNTER — Telehealth: Payer: Self-pay | Admitting: *Deleted

## 2017-05-17 NOTE — Telephone Encounter (Signed)
  Follow up Call-  Call back number 05/16/2017  Post procedure Call Back phone  # [phone]  Permission to leave phone message Yes  Some recent data might be hidden     Patient questions:  Do you have a fever, pain , or abdominal swelling? No. Pain Score  0 *  Have you tolerated food without any problems? Yes.    Have you been able to return to your normal activities? Yes.    Do you have any questions about your discharge instructions: Diet   No. Medications  No. Follow up visit  No.  Do you have questions or concerns about your Care? No.  Actions: * If pain score is 4 or above: No action needed, pain <4.

## 2017-05-21 ENCOUNTER — Encounter: Payer: Self-pay | Admitting: Internal Medicine

## 2021-12-23 ENCOUNTER — Emergency Department (HOSPITAL_BASED_OUTPATIENT_CLINIC_OR_DEPARTMENT_OTHER)

## 2021-12-23 ENCOUNTER — Inpatient Hospital Stay (HOSPITAL_BASED_OUTPATIENT_CLINIC_OR_DEPARTMENT_OTHER)
Admission: EM | Admit: 2021-12-23 | Discharge: 2021-12-26 | DRG: 281 | Disposition: A | Discharge status: Home / Self Care | Attending: Cardiovascular Disease | Admitting: Cardiovascular Disease

## 2021-12-23 ENCOUNTER — Encounter (HOSPITAL_BASED_OUTPATIENT_CLINIC_OR_DEPARTMENT_OTHER): Payer: Self-pay | Admitting: *Deleted

## 2021-12-23 ENCOUNTER — Other Ambulatory Visit: Payer: Self-pay

## 2021-12-23 DIAGNOSIS — E119 Type 2 diabetes mellitus without complications: Secondary | ICD-10-CM | POA: Diagnosis not present

## 2021-12-23 DIAGNOSIS — Z794 Long term (current) use of insulin: Secondary | ICD-10-CM

## 2021-12-23 DIAGNOSIS — Z9884 Bariatric surgery status: Secondary | ICD-10-CM | POA: Diagnosis not present

## 2021-12-23 DIAGNOSIS — K219 Gastro-esophageal reflux disease without esophagitis: Secondary | ICD-10-CM | POA: Diagnosis not present

## 2021-12-23 DIAGNOSIS — Z793 Long term (current) use of hormonal contraceptives: Secondary | ICD-10-CM

## 2021-12-23 DIAGNOSIS — I213 ST elevation (STEMI) myocardial infarction of unspecified site: Secondary | ICD-10-CM

## 2021-12-23 DIAGNOSIS — G4730 Sleep apnea, unspecified: Secondary | ICD-10-CM | POA: Diagnosis present

## 2021-12-23 DIAGNOSIS — E222 Syndrome of inappropriate secretion of antidiuretic hormone: Secondary | ICD-10-CM | POA: Diagnosis not present

## 2021-12-23 DIAGNOSIS — I214 Non-ST elevation (NSTEMI) myocardial infarction: Principal | ICD-10-CM | POA: Diagnosis present

## 2021-12-23 DIAGNOSIS — F32A Depression, unspecified: Secondary | ICD-10-CM | POA: Diagnosis not present

## 2021-12-23 DIAGNOSIS — Z7984 Long term (current) use of oral hypoglycemic drugs: Secondary | ICD-10-CM

## 2021-12-23 DIAGNOSIS — Z79899 Other long term (current) drug therapy: Secondary | ICD-10-CM

## 2021-12-23 DIAGNOSIS — I5181 Takotsubo syndrome: Secondary | ICD-10-CM

## 2021-12-23 LAB — BASIC METABOLIC PANEL
Anion gap: 12 (ref 5–15)
BUN: 10 mg/dL (ref 6–20)
CO2: 19 mmol/L — ABNORMAL LOW (ref 22–32)
Calcium: 9.8 mg/dL (ref 8.9–10.3)
Chloride: 100 mmol/L (ref 98–111)
Creatinine, Ser: 0.88 mg/dL (ref 0.44–1.00)
GFR, Estimated: >60 mL/min (ref >60)
Glucose, Bld: 184 mg/dL — ABNORMAL HIGH (ref 70–99)
Potassium: 3.7 mmol/L (ref 3.5–5.1)
Sodium: 131 mmol/L — ABNORMAL LOW (ref 135–145)

## 2021-12-23 LAB — CBC
HCT: 30.8 % — ABNORMAL LOW (ref 36.0–46.0)
Hemoglobin: 10.2 g/dL — ABNORMAL LOW (ref 12.0–15.0)
MCH: 26.3 pg (ref 26.0–34.0)
MCHC: 33.1 g/dL (ref 30.0–36.0)
MCV: 79.4 fL — ABNORMAL LOW (ref 80.0–100.0)
Platelets: 303 10*3/uL (ref 150–400)
RBC: 3.88 MIL/uL (ref 3.87–5.11)
RDW: 15 % (ref 11.5–15.5)
WBC: 10.1 10*3/uL (ref 4.0–10.5)
nRBC: 0 % (ref 0.0–0.2)

## 2021-12-23 LAB — TROPONIN I (HIGH SENSITIVITY)
Troponin I (High Sensitivity): 605 ng/L (ref <18)
Troponin I (High Sensitivity): 1456 ng/L (ref <18)

## 2021-12-23 MED ORDER — SODIUM CHLORIDE 0.9 % IV BOLUS
500.0000 mL | Freq: Once | INTRAVENOUS | Status: AC
  Administered 2021-12-23: 500 mL via INTRAVENOUS

## 2021-12-23 MED ORDER — ASPIRIN 81 MG PO CHEW
324.0000 mg | CHEWABLE_TABLET | Freq: Once | ORAL | Status: AC
Start: 2021-12-23 — End: 2021-12-23
  Administered 2021-12-23: 324 mg via ORAL
  Filled 2021-12-23: qty 4

## 2021-12-23 MED ORDER — ATORVASTATIN CALCIUM 80 MG PO TABS: 80.0000 mg | ORAL_TABLET | Freq: Every day | ORAL | Status: DC

## 2021-12-23 MED ORDER — MORPHINE SULFATE (PF) 4 MG/ML IV SOLN: 4.0000 mg | Freq: Once | INTRAVENOUS | Status: DC

## 2021-12-23 MED ORDER — HEPARIN BOLUS VIA INFUSION
4000.0000 [IU] | Freq: Once | INTRAVENOUS | Status: AC
  Administered 2021-12-23: 4000 [IU] via INTRAVENOUS

## 2021-12-23 MED ORDER — HEPARIN (PORCINE) 25000 UT/250ML-% IV SOLN
1200.0000 [IU]/h | INTRAVENOUS | Status: DC
  Administered 2021-12-23: 900 [IU]/h via INTRAVENOUS
  Filled 2021-12-23: qty 250

## 2021-12-23 MED ORDER — ONDANSETRON HCL 4 MG/2ML IJ SOLN
4.0000 mg | Freq: Once | INTRAMUSCULAR | Status: AC
  Administered 2021-12-23: 4 mg via INTRAVENOUS
  Filled 2021-12-23: qty 2

## 2021-12-23 MED ORDER — PANTOPRAZOLE SODIUM 40 MG IV SOLR
40.0000 mg | Freq: Once | INTRAVENOUS | Status: AC
  Administered 2021-12-23: 40 mg via INTRAVENOUS
  Filled 2021-12-23: qty 10

## 2021-12-23 NOTE — ED Triage Notes (Signed)
Patient reports that she has not felt well all day, reports nausea. While she is was driving to [place] from airport she had pain in left upper back that started radiating to chest and down into arm. States she feels like she is having heartburn.   Denies SOB.

## 2021-12-23 NOTE — ED Notes (Addendum)
Pt reporting increased central cp, radiating to mid back and L shoulder, now 5/10. Repeat EKG taken, Dr. Melina Copa notified

## 2021-12-23 NOTE — ED Provider Notes (Signed)
[place] Provider Note   CSN: [id number] Arrival date & time: 12/23/21  2051     History  Chief Complaint  Patient presents with   Nausea   Heartburn    Jessica Bridges is a 56 y.o. female.  She has a history of SIADH, DM, GERD.  She said she has been nauseous since about 2 PM and around 5 PM she had some violent vomiting that lasted about 15 minutes.  Around 7 PM she was driving and experienced 5 out of 10 chest pressure radiating to her left shoulder and upper back along with some heartburn symptoms into her throat.  Symptoms somewhat improved and she rates them as 3 out of 10 now.  No prior history of cardiac issues.  No abdominal pain.  Denies shortness of breath dizziness.  She said she was diaphoretic when she is vomiting but not necessarily with the chest pain.  No numbness or weakness.  She denies tobacco alcohol or any cocaine.  The history is provided by the patient and the spouse.  Chest Pain Pain location:  Substernal area Pain quality: burning and pressure   Pain radiates to:  Neck, upper back and L shoulder Pain severity:  Moderate Onset quality:  Sudden Duration:  3 hours Timing:  Constant Progression:  Improving Chronicity:  New Relieved by:  None tried Worsened by:  Nothing Ineffective treatments:  None tried Associated symptoms: back pain, diaphoresis, nausea and vomiting   Associated symptoms: no abdominal pain, no cough, no fever, no headache and no shortness of breath   Risk factors: diabetes mellitus        Home Medications Prior to Admission medications   Medication Sig Start Date End Date Taking? Authorizing Provider  azelastine (ASTELIN) 0.1 % nasal spray Place 1 spray into both nostrils 2 (two) times daily. Use in each nostril as directed   Yes [provider]  ezetimibe (ZETIA) 10 MG tablet Take 10 mg by mouth daily.   Yes [provider]  insulin glargine (LANTUS) 100 UNIT/ML injection Inject  20 Units into the skin daily. Sliding scale   Yes [provider]  montelukast (SINGULAIR) 10 MG tablet Take 10 mg by mouth at bedtime.   Yes [provider]  ondansetron (ZOFRAN) 8 MG tablet Take 8 mg by mouth every 8 (eight) hours as needed for nausea or vomiting.   Yes [provider]  tirzepatide Darcel Bayley) 2.5 MG/0.5ML Pen Inject 2.5 mg into the skin once a week.   Yes [provider]  acetaminophen (TYLENOL) 500 MG tablet Take 1,000 mg by mouth every 6 (six) hours as needed for mild pain or moderate pain.    [provider]  buPROPion (ZYBAN) 150 MG 12 hr tablet Take 150 mg by mouth daily.     [provider]  Cholecalciferol (VITAMIN D PO) Take 5,000 Units by mouth daily. Patient uses 5000 iu.     [provider]  escitalopram (LEXAPRO) 10 MG tablet Take 10 mg by mouth daily.    [provider]  fluconazole (DIFLUCAN) 150 MG tablet Take 1 tablet (150 mg total) by mouth once. Patient taking differently: Take 150 mg by mouth as needed (occasionally).  06/12/13   Antonietta Breach, PA-C  glipiZIDE (GLUCOTROL XL) 5 MG 24 hr tablet Take 5 mg by mouth daily with breakfast.    [provider]  Iron TABS Take 22 mg by mouth daily.    [provider]  lamoTRIgine (LAMICTAL)  100 MG tablet Take 100 mg by mouth daily.    [provider]  levocetirizine (XYZAL) 5 MG tablet Take 5 mg by mouth every evening.    [provider]  magnesium gluconate (MAGONATE) 500 MG tablet Take 500 mg by mouth daily.    [provider]  METFORMIN HCL ER PO Take 1,000 mg by mouth in the morning and at bedtime.    [provider]  Multiple Vitamin (MULTIVITAMIN) tablet Take 1 tablet by mouth daily.    [provider]  NATEGLINIDE PO Take by mouth 2 (two) times daily.    [provider]  norethindrone-ethinyl estradiol (JUNEL FE,GILDESS FE,LOESTRIN FE) 1-20 MG-MCG tablet Take 1 tablet by  mouth daily.    [provider]  omeprazole (PRILOSEC) 40 MG capsule Take 40 mg by mouth daily.     [provider]  sitaGLIPtin (JANUVIA) 100 MG tablet Take 100 mg by mouth daily.    [provider]  vitamin B-12 (CYANOCOBALAMIN) 1000 MCG tablet Take 1,000 mcg by mouth daily.    [provider]  vortioxetine HBr (TRINTELLIX) 10 MG TABS Take by mouth.    [provider]      Allergies    Patient has no known allergies.    Review of Systems   Review of Systems  Constitutional:  Positive for diaphoresis. Negative for fever.  HENT:  Negative for nosebleeds.   Eyes:  Negative for visual disturbance.  Respiratory:  Negative for cough and shortness of breath.   Cardiovascular:  Positive for chest pain.  Gastrointestinal:  Positive for nausea and vomiting. Negative for abdominal pain.  Genitourinary:  Negative for dysuria.  Musculoskeletal:  Positive for back pain.  Skin:  Negative for rash.  Neurological:  Negative for headaches.    Physical Exam Updated Vital Signs BP (!) 150/85 (BP Location: Left Arm)   Pulse 92   Temp 98.1 F (36.7 C)   Resp 16   SpO2 99%  Physical Exam Vitals and nursing note reviewed.  Constitutional:      General: She is not in acute distress.    Appearance: Normal appearance. She is well-developed.  HENT:     Head: Normocephalic and atraumatic.  Eyes:     Conjunctiva/sclera: Conjunctivae normal.  Cardiovascular:     Rate and Rhythm: Normal rate and regular rhythm.     Pulses: Normal pulses.     Heart sounds: No murmur heard. Pulmonary:     Effort: Pulmonary effort is normal. No respiratory distress.     Breath sounds: Normal breath sounds.  Abdominal:     Palpations: Abdomen is soft.     Tenderness: There is no abdominal tenderness. There is no guarding or rebound.  Musculoskeletal:        General: No tenderness. Normal range of motion.     Cervical back: Neck supple.     Right lower leg: No edema.      Left lower leg: No edema.  Skin:    General: Skin is warm and dry.     Capillary Refill: Capillary refill takes less than 2 seconds.  Neurological:     General: No focal deficit present.     Mental Status: She is alert.     ED Results / Procedures / Treatments   Labs (all labs ordered are listed, but only abnormal results are displayed) Labs Reviewed  BASIC METABOLIC PANEL - Abnormal; Notable for the following components:      Result Value  Sodium 131 (*)    CO2 19 (*)    Glucose, Bld 184 (*)    All other components within normal limits  CBC - Abnormal; Notable for the following components:   Hemoglobin 10.2 (*)    HCT 30.8 (*)    MCV 79.4 (*)    All other components within normal limits  GLUCOSE, CAPILLARY - Abnormal; Notable for the following components:   Glucose-Capillary 147 (*)    All other components within normal limits  HEPARIN LEVEL (UNFRACTIONATED) - Abnormal; Notable for the following components:   Heparin Unfractionated 0.18 (*)    All other components within normal limits  CBC - Abnormal; Notable for the following components:   RBC 3.43 (*)    Hemoglobin 9.4 (*)    HCT 27.6 (*)    All other components within normal limits  BASIC METABOLIC PANEL - Abnormal; Notable for the following components:   Sodium 133 (*)    Glucose, Bld 111 (*)    Calcium 8.6 (*)    All other components within normal limits  HEMOGLOBIN A1C - Abnormal; Notable for the following components:   Hgb A1c MFr Bld 7.0 (*)    All other components within normal limits  GLUCOSE, CAPILLARY - Abnormal; Notable for the following components:   Glucose-Capillary 135 (*)    All other components within normal limits  TROPONIN I (HIGH SENSITIVITY) - Abnormal; Notable for the following components:   Troponin I (High Sensitivity) 605 (*)    All other components within normal limits  TROPONIN I (HIGH SENSITIVITY) - Abnormal; Notable for the following components:   Troponin I (High Sensitivity)  1,456 (*)    All other components within normal limits  TROPONIN I (HIGH SENSITIVITY) - Abnormal; Notable for the following components:   Troponin I (High Sensitivity) 1,500 (*)    All other components within normal limits  TROPONIN I (HIGH SENSITIVITY) - Abnormal; Notable for the following components:   Troponin I (High Sensitivity) 1,083 (*)    All other components within normal limits  LIPID PANEL  PROTIME-INR  HIV ANTIBODY (ROUTINE TESTING W REFLEX)  LIPOPROTEIN A (LPA)  HEPARIN LEVEL (UNFRACTIONATED)    EKG EKG Interpretation  Date/Time:  Saturday December 23 2021 21:00:03 EDT Ventricular Rate:  93 PR Interval:  160 QRS Duration: 86 QT Interval:  362 QTC Calculation: 450 R Axis:   65 Text Interpretation: Normal sinus rhythm Septal infarct , age undetermined Abnormal ECG increased rate from prior 11/13 Confirmed by Aletta Edouard [phone]) on 12/23/2021 9:07:42 PM  Radiology DG Chest Port 1 View  Result Date: 12/23/2021 CLINICAL DATA:  Chest pain. EXAM: PORTABLE CHEST 1 VIEW COMPARISON:  Chest radiograph dated 12/22/2012. FINDINGS: No focal consolidation, pleural effusion, pneumothorax. The cardiac silhouette is within normal limits. No acute osseous pathology. IMPRESSION: No active disease. Electronically Signed   By: Anner Crete M.D.   On: 12/23/2021 22:21    Procedures .Critical Care  Performed by: Hayden Rasmussen, MD Authorized by: Hayden Rasmussen, MD   Critical care provider statement:    Critical care time (minutes):  45   Critical care time was exclusive of:  Separately billable procedures and treating other patients   Critical care was necessary to treat or prevent imminent or life-threatening deterioration of the following conditions:  Cardiac failure   Critical care was time spent personally by me on the following activities:  Development of treatment plan with patient or surrogate, discussions with consultants, evaluation of patient's response to  treatment,  examination of patient, obtaining history from patient or surrogate, ordering and performing treatments and interventions, ordering and review of laboratory studies, ordering and review of radiographic studies, pulse oximetry, re-evaluation of patient's condition and review of old charts   I assumed direction of critical care for this patient from another provider in my specialty: no       Medications Ordered in ED Medications  heparin ADULT infusion 100 units/mL (25000 units/228m) (1,200 Units/hr Intravenous Handoff 12/24/21 0736)  aspirin EC tablet 81 mg (has no administration in time range)  nitroGLYCERIN (NITROSTAT) SL tablet 0.4 mg (has no administration in time range)  acetaminophen (TYLENOL) tablet 650 mg (has no administration in time range)  ondansetron (ZOFRAN) injection 4 mg (has no administration in time range)  ticagrelor (BRILINTA) tablet 90 mg (90 mg Oral Given 12/24/21 0215)  metoprolol tartrate (LOPRESSOR) tablet 12.5 mg (has no administration in time range)  atorvastatin (LIPITOR) tablet 80 mg (80 mg Oral Given 12/24/21 0215)  nitroGLYCERIN 50 mg in dextrose 5 % 250 mL (0.2 mg/mL) infusion (75 mcg/min Intravenous Rate/Dose Change 12/24/21 0639)  pantoprazole (PROTONIX) injection 40 mg (has no administration in time range)  morphine (PF) 2 MG/ML injection 2 mg (2 mg Intravenous Given 12/24/21 0645)  ezetimibe (ZETIA) tablet 10 mg (has no administration in time range)  vortioxetine HBr (TRINTELLIX) tablet 10 mg (has no administration in time range)  lamoTRIgine (LAMICTAL) tablet 100 mg (has no administration in time range)  loratadine (CLARITIN) tablet 10 mg (has no administration in time range)  montelukast (SINGULAIR) tablet 10 mg (has no administration in time range)  insulin aspart (novoLOG) injection 0-9 Units ( Subcutaneous Not Given 12/24/21 0739)  sodium chloride 0.9 % bolus 500 mL (0 mLs Intravenous Stopped 12/23/21 2256)  ondansetron (ZOFRAN) injection 4 mg (4 mg  Intravenous Given 12/23/21 2223)  pantoprazole (PROTONIX) injection 40 mg (40 mg Intravenous Given 12/23/21 2223)  aspirin chewable tablet 324 mg (324 mg Oral Given 12/23/21 2229)  heparin bolus via infusion 4,000 Units (4,000 Units Intravenous Bolus from Bag 12/23/21 2314)  sodium chloride 0.9 % bolus 500 mL ( Intravenous Stopped 12/24/21 0318)    ED Course/ Medical Decision Making/ A&P Clinical Course as of 12/24/21 0933  Sat Dec 23, 2021  2225 Chest x-ray interpreted by me as no acute disease. [MB]  2225 Discussed with Dr. KHumphrey Rollscardiology fellow.  He is recommending heparinization, statin, aspirin, admission to his service cardiology telemetry for anticipation of catheterization tomorrow.  Temporary admission orders placed by me. [MB]    Clinical Course User Index [MB] BHayden Rasmussen MD                           Medical Decision Making Amount and/or Complexity of Data Reviewed Labs: ordered. Radiology: ordered.  Risk OTC drugs. Prescription drug management. Decision regarding hospitalization.  This patient complains of burning chest pain in the setting of recent nausea vomiting; this involves an extensive number of treatment Options and is a complaint that carries with it a high risk of complications and morbidity. The differential includes GERD, gastritis, peptic ulcer disease, perforation, ACS, pneumonia, pneumothorax, PE, dissection  I ordered, reviewed and interpreted labs, which included CBC with normal white count, hemoglobin low slightly down from priors, chemistries with low sodium and bicarb likely due to her recent vomiting, troponins elevated and rising I ordered medication oral aspirin IV heparin IV PPI IV pain medication with improvement in her symptoms  and reviewed PMP when indicated. I ordered imaging studies which included chest x-ray and I independently    visualized and interpreted imaging which showed no acute findings Additional history obtained from patient's  spouse Previous records obtained and reviewed in epic no recent cardiac work-ups I consulted Dr. Humphrey Rolls, cardiology and discussed lab and imaging findings and discussed disposition.  Cardiac monitoring reviewed, normal sinus rhythm Social determinants considered, no significant barriers Critical Interventions: Initiation of aspirin heparin for NSTEMI  After the interventions stated above, I reevaluated the patient and found patient's pain to be improved Admission and further testing considered, she will need admission to the hospital for further cardiac work-up including likely cardiac cath.  Patient agreeable plan.          Final Clinical Impression(s) / ED Diagnoses Final diagnoses:  NSTEMI (non-ST elevated myocardial infarction) [facility])    Rx / DC Orders ED Discharge Orders     None         Hayden Rasmussen, MD 12/24/21 [id number]

## 2021-12-23 NOTE — Progress Notes (Signed)
ANTICOAGULATION CONSULT NOTE - Initial Consult  Pharmacy Consult for heparin Indication: chest pain/ACS  No Known Allergies  Patient Measurements: Weight: 77.6 kg (171 lb)   Vital Signs: Temp: 98.1 F (36.7 C) (06/10 2103) BP: 149/86 (06/10 2215) Pulse Rate: 94 (06/10 2230)  Labs: Recent Labs    12/23/21 2116  HGB 10.2*  HCT 30.8*  PLT 303  CREATININE 0.88  TROPONINIHS 605*    CrCl cannot be calculated (Unknown ideal weight.).   Medical History: Past Medical History:  Diagnosis Date   Diabetes mellitus without complication ([place])    Sleep apnea    CPAP     Assessment: 28 yoF presenting with chest pain. No anticoagulation reported prior to admission. Hgb 10.2, PLT WNL; Pharmacy to dose heparin  Goal of Therapy:  Heparin level 0.3-0.7 units/ml Monitor platelets by anticoagulation protocol: Yes   Plan:  Give 4000 units bolus x 1 Start heparin infusion at 900 units/hr Check anti-Xa level in 6 hours and daily while on heparin Continue to monitor H&H and platelets  Georga Bora, PharmD Clinical Pharmacist 12/23/2021 10:48 PM Please check AMION for all [place] numbers

## 2021-12-23 NOTE — ED Notes (Signed)
Attempted to call report to [phone] 

## 2021-12-24 ENCOUNTER — Encounter (HOSPITAL_COMMUNITY)
Admission: EM | Disposition: A | Discharge status: Home / Self Care | Payer: Self-pay | Source: Home / Self Care | Attending: Internal Medicine

## 2021-12-24 ENCOUNTER — Inpatient Hospital Stay (HOSPITAL_COMMUNITY)

## 2021-12-24 DIAGNOSIS — Z79899 Other long term (current) drug therapy: Secondary | ICD-10-CM | POA: Diagnosis not present

## 2021-12-24 DIAGNOSIS — E119 Type 2 diabetes mellitus without complications: Secondary | ICD-10-CM | POA: Diagnosis present

## 2021-12-24 DIAGNOSIS — Z7984 Long term (current) use of oral hypoglycemic drugs: Secondary | ICD-10-CM | POA: Diagnosis not present

## 2021-12-24 DIAGNOSIS — I248 Other forms of acute ischemic heart disease: Secondary | ICD-10-CM | POA: Diagnosis not present

## 2021-12-24 DIAGNOSIS — F32A Depression, unspecified: Secondary | ICD-10-CM | POA: Diagnosis present

## 2021-12-24 DIAGNOSIS — E222 Syndrome of inappropriate secretion of antidiuretic hormone: Secondary | ICD-10-CM | POA: Diagnosis present

## 2021-12-24 DIAGNOSIS — I5181 Takotsubo syndrome: Secondary | ICD-10-CM | POA: Diagnosis not present

## 2021-12-24 DIAGNOSIS — G4730 Sleep apnea, unspecified: Secondary | ICD-10-CM | POA: Diagnosis present

## 2021-12-24 DIAGNOSIS — I214 Non-ST elevation (NSTEMI) myocardial infarction: Secondary | ICD-10-CM | POA: Diagnosis present

## 2021-12-24 DIAGNOSIS — Z9884 Bariatric surgery status: Secondary | ICD-10-CM | POA: Diagnosis not present

## 2021-12-24 DIAGNOSIS — Z794 Long term (current) use of insulin: Secondary | ICD-10-CM | POA: Diagnosis not present

## 2021-12-24 DIAGNOSIS — I2510 Atherosclerotic heart disease of native coronary artery without angina pectoris: Secondary | ICD-10-CM | POA: Diagnosis not present

## 2021-12-24 DIAGNOSIS — Z793 Long term (current) use of hormonal contraceptives: Secondary | ICD-10-CM | POA: Diagnosis not present

## 2021-12-24 DIAGNOSIS — K219 Gastro-esophageal reflux disease without esophagitis: Secondary | ICD-10-CM | POA: Diagnosis present

## 2021-12-24 HISTORY — PX: CORONARY/GRAFT ACUTE MI REVASCULARIZATION: CATH118305

## 2021-12-24 HISTORY — PX: LEFT HEART CATH AND CORONARY ANGIOGRAPHY: CATH118249

## 2021-12-24 LAB — CBC
HCT: 27.6 % — ABNORMAL LOW (ref 36.0–46.0)
Hemoglobin: 9.4 g/dL — ABNORMAL LOW (ref 12.0–15.0)
MCH: 27.4 pg (ref 26.0–34.0)
MCHC: 34.1 g/dL (ref 30.0–36.0)
MCV: 80.5 fL (ref 80.0–100.0)
Platelets: 275 10*3/uL (ref 150–400)
RBC: 3.43 MIL/uL — ABNORMAL LOW (ref 3.87–5.11)
RDW: 15.1 % (ref 11.5–15.5)
WBC: 9 10*3/uL (ref 4.0–10.5)
nRBC: 0 % (ref 0.0–0.2)

## 2021-12-24 LAB — TROPONIN I (HIGH SENSITIVITY)
Troponin I (High Sensitivity): 1500 ng/L (ref <18)
Troponin I (High Sensitivity): 1083 ng/L (ref <18)

## 2021-12-24 LAB — PROTIME-INR
INR: 1.1 (ref 0.8–1.2)
Prothrombin Time: 13.7 seconds (ref 11.4–15.2)

## 2021-12-24 LAB — BASIC METABOLIC PANEL
Anion gap: 10 (ref 5–15)
BUN: 7 mg/dL (ref 6–20)
CO2: 22 mmol/L (ref 22–32)
Calcium: 8.6 mg/dL — ABNORMAL LOW (ref 8.9–10.3)
Chloride: 101 mmol/L (ref 98–111)
Creatinine, Ser: 0.88 mg/dL (ref 0.44–1.00)
GFR, Estimated: >60 mL/min (ref >60)
Glucose, Bld: 111 mg/dL — ABNORMAL HIGH (ref 70–99)
Potassium: 3.5 mmol/L (ref 3.5–5.1)
Sodium: 133 mmol/L — ABNORMAL LOW (ref 135–145)

## 2021-12-24 LAB — ECHOCARDIOGRAM COMPLETE
AR max vel: 3.13 cm2
AV Peak grad: 6 mmHg
Ao pk vel: 1.23 m/s
Area-P 1/2: 5.38 cm2
Height: 66 in
S' Lateral: 4.2 cm
Single Plane A4C EF: 41.1 %
Weight: 2779.2 oz

## 2021-12-24 LAB — POCT ACTIVATED CLOTTING TIME: Activated Clotting Time: 233 seconds

## 2021-12-24 LAB — GLUCOSE, CAPILLARY
Glucose-Capillary: 147 mg/dL — ABNORMAL HIGH (ref 70–99)
Glucose-Capillary: 135 mg/dL — ABNORMAL HIGH (ref 70–99)
Glucose-Capillary: 125 mg/dL — ABNORMAL HIGH (ref 70–99)
Glucose-Capillary: 104 mg/dL — ABNORMAL HIGH (ref 70–99)
Glucose-Capillary: 142 mg/dL — ABNORMAL HIGH (ref 70–99)

## 2021-12-24 LAB — LIPID PANEL
Cholesterol: 133 mg/dL (ref 0–200)
HDL: 66 mg/dL (ref >40)
LDL Cholesterol: 43 mg/dL (ref 0–99)
Total CHOL/HDL Ratio: 2 RATIO
Triglycerides: 120 mg/dL (ref <150)
VLDL: 24 mg/dL (ref 0–40)

## 2021-12-24 LAB — HEPARIN LEVEL (UNFRACTIONATED): Heparin Unfractionated: 0.18 IU/mL — ABNORMAL LOW (ref 0.30–0.70)

## 2021-12-24 LAB — HEMOGLOBIN A1C
Hgb A1c MFr Bld: 7 % — ABNORMAL HIGH (ref 4.8–5.6)
Mean Plasma Glucose: 154.2 mg/dL

## 2021-12-24 LAB — HIV ANTIBODY (ROUTINE TESTING W REFLEX): HIV Screen 4th Generation wRfx: NONREACTIVE

## 2021-12-24 SURGERY — CORONARY/GRAFT ACUTE MI REVASCULARIZATION
Anesthesia: LOCAL

## 2021-12-24 MED ORDER — EZETIMIBE 10 MG PO TABS
10.0000 mg | ORAL_TABLET | Freq: Every day | ORAL | Status: DC
  Administered 2021-12-24 – 2021-12-26 (×3): 10 mg via ORAL
  Filled 2021-12-24 (×3): qty 1

## 2021-12-24 MED ORDER — LAMOTRIGINE 100 MG PO TABS
100.0000 mg | ORAL_TABLET | Freq: Every day | ORAL | Status: DC
  Administered 2021-12-24 – 2021-12-26 (×3): 100 mg via ORAL
  Filled 2021-12-24 (×4): qty 1

## 2021-12-24 MED ORDER — IOHEXOL 350 MG/ML SOLN
INTRAVENOUS | Status: DC | PRN
  Administered 2021-12-24: 70 mL via INTRA_ARTERIAL

## 2021-12-24 MED ORDER — SODIUM CHLORIDE 0.9 % IV SOLN: 250.0000 mL | INTRAVENOUS | Status: DC | PRN

## 2021-12-24 MED ORDER — ESCITALOPRAM OXALATE 10 MG PO TABS: 10.0000 mg | ORAL_TABLET | Freq: Every day | ORAL | Status: DC

## 2021-12-24 MED ORDER — HEPARIN SODIUM (PORCINE) 1000 UNIT/ML IJ SOLN
INTRAMUSCULAR | Status: AC
Start: 2021-12-24 — End: ?
  Filled 2021-12-24: qty 10

## 2021-12-24 MED ORDER — SODIUM CHLORIDE 0.9% FLUSH
3.0000 mL | Freq: Two times a day (BID) | INTRAVENOUS | Status: DC
  Administered 2021-12-25 – 2021-12-26 (×3): 3 mL via INTRAVENOUS

## 2021-12-24 MED ORDER — SODIUM CHLORIDE 0.9 % WEIGHT BASED INFUSION
3.0000 mL/kg/h | INTRAVENOUS | Status: DC
  Administered 2021-12-24: 3 mL/kg/h via INTRAVENOUS

## 2021-12-24 MED ORDER — LORATADINE 10 MG PO TABS
10.0000 mg | ORAL_TABLET | Freq: Every evening | ORAL | Status: DC
Start: 2021-12-24 — End: 2021-12-26
  Administered 2021-12-24 – 2021-12-25 (×2): 10 mg via ORAL
  Filled 2021-12-24 (×2): qty 1

## 2021-12-24 MED ORDER — NITROGLYCERIN IN D5W 200-5 MCG/ML-% IV SOLN
0.0000 ug/min | INTRAVENOUS | Status: DC
  Administered 2021-12-24: 45 ug/min via INTRAVENOUS

## 2021-12-24 MED ORDER — SODIUM CHLORIDE 0.9% FLUSH
3.0000 mL | Freq: Two times a day (BID) | INTRAVENOUS | Status: DC
  Administered 2021-12-24: 3 mL via INTRAVENOUS

## 2021-12-24 MED ORDER — LIDOCAINE HCL (PF) 1 % IJ SOLN
INTRAMUSCULAR | Status: AC
  Filled 2021-12-24: qty 30

## 2021-12-24 MED ORDER — ASPIRIN 81 MG PO TBEC: 81.0000 mg | DELAYED_RELEASE_TABLET | Freq: Every day | ORAL | Status: DC

## 2021-12-24 MED ORDER — MIDAZOLAM HCL 2 MG/2ML IJ SOLN
INTRAMUSCULAR | Status: DC | PRN
  Administered 2021-12-24 (×2): 1 mg via INTRAVENOUS
  Administered 2021-12-24: 2 mg via INTRAVENOUS

## 2021-12-24 MED ORDER — SODIUM CHLORIDE 0.9 % IV SOLN: INTRAVENOUS | Status: AC

## 2021-12-24 MED ORDER — ONDANSETRON HCL 4 MG/2ML IJ SOLN: 4.0000 mg | Freq: Four times a day (QID) | INTRAMUSCULAR | Status: DC | PRN

## 2021-12-24 MED ORDER — HEPARIN (PORCINE) IN NACL 1000-0.9 UT/500ML-% IV SOLN
INTRAVENOUS | Status: DC | PRN
  Administered 2021-12-24 (×4): 500 mL

## 2021-12-24 MED ORDER — FENTANYL CITRATE (PF) 100 MCG/2ML IJ SOLN
INTRAMUSCULAR | Status: AC
  Filled 2021-12-24: qty 2

## 2021-12-24 MED ORDER — VERAPAMIL HCL 2.5 MG/ML IV SOLN
INTRAVENOUS | Status: DC | PRN
  Administered 2021-12-24: 10 mL via INTRA_ARTERIAL

## 2021-12-24 MED ORDER — FENTANYL CITRATE (PF) 100 MCG/2ML IJ SOLN
INTRAMUSCULAR | Status: DC | PRN
  Administered 2021-12-24 (×3): 25 ug via INTRAVENOUS

## 2021-12-24 MED ORDER — MIDAZOLAM HCL 2 MG/2ML IJ SOLN
INTRAMUSCULAR | Status: AC
  Filled 2021-12-24: qty 2

## 2021-12-24 MED ORDER — TICAGRELOR 90 MG PO TABS
90.0000 mg | ORAL_TABLET | Freq: Two times a day (BID) | ORAL | Status: DC
  Administered 2021-12-24 (×2): 90 mg via ORAL
  Filled 2021-12-24 (×2): qty 1

## 2021-12-24 MED ORDER — NITROGLYCERIN 1 MG/10 ML FOR IR/CATH LAB
INTRA_ARTERIAL | Status: AC
  Filled 2021-12-24: qty 10

## 2021-12-24 MED ORDER — METOPROLOL TARTRATE 25 MG PO TABS
25.0000 mg | ORAL_TABLET | Freq: Two times a day (BID) | ORAL | Status: DC
  Administered 2021-12-24: 25 mg via ORAL
  Filled 2021-12-24 (×2): qty 1

## 2021-12-24 MED ORDER — MONTELUKAST SODIUM 10 MG PO TABS
10.0000 mg | ORAL_TABLET | Freq: Every day | ORAL | Status: DC
  Administered 2021-12-24 – 2021-12-25 (×2): 10 mg via ORAL
  Filled 2021-12-24 (×2): qty 1

## 2021-12-24 MED ORDER — SODIUM CHLORIDE 0.9% FLUSH: 3.0000 mL | INTRAVENOUS | Status: DC | PRN

## 2021-12-24 MED ORDER — ENOXAPARIN SODIUM 40 MG/0.4ML IJ SOSY
40.0000 mg | PREFILLED_SYRINGE | INTRAMUSCULAR | Status: DC
  Administered 2021-12-25 – 2021-12-26 (×2): 40 mg via SUBCUTANEOUS
  Filled 2021-12-24 (×2): qty 0.4

## 2021-12-24 MED ORDER — ASPIRIN 81 MG PO CHEW
81.0000 mg | CHEWABLE_TABLET | Freq: Every day | ORAL | Status: DC
  Administered 2021-12-24: 81 mg via ORAL
  Filled 2021-12-24: qty 1

## 2021-12-24 MED ORDER — ACETAMINOPHEN 325 MG PO TABS
650.0000 mg | ORAL_TABLET | ORAL | Status: DC | PRN
  Filled 2021-12-24: qty 2

## 2021-12-24 MED ORDER — HYDRALAZINE HCL 20 MG/ML IJ SOLN: 10.0000 mg | INTRAMUSCULAR | Status: AC | PRN

## 2021-12-24 MED ORDER — VERAPAMIL HCL 2.5 MG/ML IV SOLN
INTRAVENOUS | Status: AC
Start: 2021-12-24 — End: ?
  Filled 2021-12-24: qty 2

## 2021-12-24 MED ORDER — HEPARIN (PORCINE) IN NACL 1000-0.9 UT/500ML-% IV SOLN
INTRAVENOUS | Status: AC
  Filled 2021-12-24: qty 1000

## 2021-12-24 MED ORDER — NITROGLYCERIN 1 MG/10 ML FOR IR/CATH LAB
INTRA_ARTERIAL | Status: DC | PRN
  Administered 2021-12-24: 200 ug via INTRA_ARTERIAL

## 2021-12-24 MED ORDER — LIDOCAINE HCL (PF) 1 % IJ SOLN
INTRAMUSCULAR | Status: DC | PRN
  Administered 2021-12-24: 2 mL

## 2021-12-24 MED ORDER — HEPARIN SODIUM (PORCINE) 1000 UNIT/ML IJ SOLN
INTRAMUSCULAR | Status: DC | PRN
  Administered 2021-12-24: 5000 [IU] via INTRAVENOUS

## 2021-12-24 MED ORDER — SODIUM CHLORIDE 0.9 % IV BOLUS
500.0000 mL | Freq: Once | INTRAVENOUS | Status: AC
  Administered 2021-12-24: 500 mL via INTRAVENOUS

## 2021-12-24 MED ORDER — NITROGLYCERIN 0.4 MG SL SUBL: 0.4000 mg | SUBLINGUAL_TABLET | SUBLINGUAL | Status: DC | PRN

## 2021-12-24 MED ORDER — SODIUM CHLORIDE 0.9 % WEIGHT BASED INFUSION: 1.0000 mL/kg/h | INTRAVENOUS | Status: DC

## 2021-12-24 MED ORDER — PANTOPRAZOLE SODIUM 40 MG IV SOLR
40.0000 mg | Freq: Two times a day (BID) | INTRAVENOUS | Status: DC
  Filled 2021-12-24: qty 10

## 2021-12-24 MED ORDER — VORTIOXETINE HBR 5 MG PO TABS
10.0000 mg | ORAL_TABLET | Freq: Every day | ORAL | Status: DC
  Administered 2021-12-24 – 2021-12-26 (×3): 10 mg via ORAL
  Filled 2021-12-24 (×3): qty 2

## 2021-12-24 MED ORDER — LABETALOL HCL 5 MG/ML IV SOLN: 10.0000 mg | INTRAVENOUS | Status: AC | PRN

## 2021-12-24 MED ORDER — INSULIN ASPART 100 UNIT/ML IJ SOLN
0.0000 [IU] | Freq: Three times a day (TID) | INTRAMUSCULAR | Status: DC
  Administered 2021-12-25 – 2021-12-26 (×2): 2 [IU] via SUBCUTANEOUS

## 2021-12-24 MED ORDER — METOPROLOL TARTRATE 12.5 MG HALF TABLET: 12.5000 mg | ORAL_TABLET | Freq: Two times a day (BID) | ORAL | Status: DC

## 2021-12-24 MED ORDER — MORPHINE SULFATE (PF) 2 MG/ML IV SOLN
2.0000 mg | INTRAVENOUS | Status: DC | PRN
  Administered 2021-12-24 (×2): 2 mg via INTRAVENOUS
  Filled 2021-12-24 (×2): qty 1

## 2021-12-24 MED ORDER — ATORVASTATIN CALCIUM 80 MG PO TABS
80.0000 mg | ORAL_TABLET | Freq: Every day | ORAL | Status: DC
  Administered 2021-12-24: 80 mg via ORAL
  Filled 2021-12-24 (×2): qty 1

## 2021-12-24 SURGICAL SUPPLY — 13 items
BAND ZEPHYR COMPRESS 30 LONG (HEMOSTASIS) ×1 IMPLANT
CATH INFINITI JR4 5F (CATHETERS) ×1 IMPLANT
CATH LAUNCHER 5F EBU3.5 (CATHETERS) ×1 IMPLANT
ELECT DEFIB PAD ADLT CADENCE (PAD) ×1 IMPLANT
GLIDESHEATH SLEND SS 6F .021 (SHEATH) ×1 IMPLANT
GUIDEWIRE INQWIRE 1.5J.035X260 (WIRE) IMPLANT
INQWIRE 1.5J .035X260CM (WIRE) ×2
KIT ENCORE 26 ADVANTAGE (KITS) ×1 IMPLANT
KIT HEART LEFT (KITS) ×2 IMPLANT
KIT HEMO VALVE WATCHDOG (MISCELLANEOUS) ×1 IMPLANT
PACK CARDIAC CATHETERIZATION (CUSTOM PROCEDURE TRAY) ×2 IMPLANT
TRANSDUCER W/STOPCOCK (MISCELLANEOUS) ×2 IMPLANT
TUBING CIL FLEX 10 FLL-RA (TUBING) ×2 IMPLANT

## 2021-12-24 NOTE — H&P (Signed)
Cardiology Admission History and Physical:   Patient ID: Jessica Bridges MRN: [id number]; DOB: 10/29/1965   Admission date: 12/23/2021  PCP:  Fanny Bien, MD   [facility] Providers Cardiologist:  None        Chief Complaint:  Burning Chest pain  Patient Profile:   Jessica Bridges is a 56 y.o. female with DM, GERD and Depression who is being seen 12/24/2021 for the evaluation of NSTEMI.  History of Present Illness:   Jessica Bridges is a 56 y.o. female with DM, GERD and Depression who is being seen 12/24/2021 for the evaluation of NSTEMI. She states she has been in usual health till this morning when she got nauseous. Initially she ignored it but around 7 pm she had some chest pressure with heartburn symptoms in to her throat hence came to the ED where her troponin were 500 and then 1400. [facility]. Here, her pain is stable but still present. She says it burning in her throat. No radiation. VS stable. No numbness or weakness.  She denies tobacco alcohol or any cocaine. Denies SOB. She has been started on IV heparin and IV nitroglycerin.  No prior history of cardiac issues.  Past Medical History:  Diagnosis Date   Diabetes mellitus without complication ([place])    Sleep apnea    CPAP    Past Surgical History:  Procedure Laterality Date   BREAST BIOPSY     right   CHOLECYSTECTOMY     DENTAL RESTORATION/EXTRACTION WITH X-RAY  12/2016   GASTRIC BYPASS     LIVER BIOPSY     WISDOM TOOTH EXTRACTION       Medications Prior to Admission: Prior to Admission medications   Medication Sig Start Date End Date Taking? Authorizing Provider  azelastine (ASTELIN) 0.1 % nasal spray Place 1 spray into both nostrils 2 (two) times daily. Use in each nostril as directed   Yes [provider]  ezetimibe (ZETIA) 10 MG tablet Take 10 mg by mouth daily.   Yes [provider]  insulin glargine (LANTUS) 100 UNIT/ML injection Inject 20 Units into the skin  daily. Sliding scale   Yes [provider]  montelukast (SINGULAIR) 10 MG tablet Take 10 mg by mouth at bedtime.   Yes [provider]  ondansetron (ZOFRAN) 8 MG tablet Take 8 mg by mouth every 8 (eight) hours as needed for nausea or vomiting.   Yes [provider]  tirzepatide Darcel Bayley) 2.5 MG/0.5ML Pen Inject 2.5 mg into the skin once a week.   Yes [provider]  acetaminophen (TYLENOL) 500 MG tablet Take 1,000 mg by mouth every 6 (six) hours as needed for mild pain or moderate pain.    [provider]  buPROPion (ZYBAN) 150 MG 12 hr tablet Take 150 mg by mouth daily.     [provider]  Cholecalciferol (VITAMIN D PO) Take 5,000 Units by mouth daily. Patient uses 5000 iu.     [provider]  escitalopram (LEXAPRO) 10 MG tablet Take 10 mg by mouth daily.    [provider]  fluconazole (DIFLUCAN) 150 MG tablet Take 1 tablet (150 mg total) by mouth once. Patient taking differently: Take 150 mg by mouth as needed (occasionally).  06/12/13   Antonietta Breach, PA-C  glipiZIDE (GLUCOTROL XL) 5 MG 24 hr tablet Take 5 mg by mouth daily with breakfast.    [provider]  Iron TABS Take 22 mg by mouth daily.  [provider]  lamoTRIgine (LAMICTAL) 100 MG tablet Take 100 mg by mouth daily.    [provider]  levocetirizine (XYZAL) 5 MG tablet Take 5 mg by mouth every evening.    [provider]  magnesium gluconate (MAGONATE) 500 MG tablet Take 500 mg by mouth daily.    [provider]  METFORMIN HCL ER PO Take 1,000 mg by mouth in the morning and at bedtime.    [provider]  Multiple Vitamin (MULTIVITAMIN) tablet Take 1 tablet by mouth daily.    [provider]  NATEGLINIDE PO Take by mouth 2 (two) times daily.    [provider]  norethindrone-ethinyl estradiol (JUNEL FE,GILDESS FE,LOESTRIN FE) 1-20 MG-MCG tablet Take 1 tablet by mouth daily.    [provider]  omeprazole (PRILOSEC) 40 MG capsule Take 40 mg by mouth daily.     [provider]  sitaGLIPtin (JANUVIA) 100 MG tablet Take 100 mg by mouth daily.    [provider]  vitamin B-12 (CYANOCOBALAMIN) 1000 MCG tablet Take 1,000 mcg by mouth daily.    [provider]  vortioxetine HBr (TRINTELLIX) 10 MG TABS Take by mouth.    [provider]     Allergies:   No Known Allergies  Social History:   Social History   Socioeconomic History   Marital status: Married    Spouse name: Not on file   Number of children: Not on file   Years of education: Not on file   Highest education level: Not on file  Occupational History   Not on file  Tobacco Use   Smoking status: Never   Smokeless tobacco: Never  Substance and Sexual Activity   Alcohol use: No   Drug use: No   Sexual activity: Not on file  Other Topics Concern   Not on file  Social History Narrative   Not on file   Social Determinants of Health   Financial Resource Strain: Not on file  Food Insecurity: Not on file  Transportation Needs: Not on file  Physical Activity: Not on file  Stress: Not on file  Social Connections: Not on file  Intimate Partner Violence: Not on file    Family History:  No sudden deaths The patient's family history is negative for Colon cancer.    ROS:  Please see the history of present illness.  All other ROS reviewed and negative.     Physical Exam/Data:   Vitals:   12/23/21 2300 12/24/21 0051 12/24/21 0114 12/24/21 0122  BP: (!) 146/91  118/78 115/75  Pulse: 95 94 94 92  Resp: 10 16    Temp:  98.2 F (36.8 C)    TempSrc:  Oral    SpO2: 100% 98% 100% 99%  Weight:  78.8 kg    Height:  '5\' 6"'$  (1.676 m)      Intake/Output Summary (Last 24 hours) at 12/24/2021 0154 Last data filed at 12/24/2021 0120 Gross per 24 hour  Intake 705.01 ml  Output --  Net 705.01 ml      12/24/2021   12:51 AM 12/23/2021   10:40 PM 05/16/2017    8:25 AM  Last  3 Weights  Weight (lbs) 173 lb 11.2 oz 171 lb 185 lb  Weight (kg) 78.79 kg 77.565 kg 83.915 kg     Body mass index is 28.04 kg/m.  General:  Well nourished, well developed, mild distress HEENT: normal Neck: no JVD Vascular: No carotid bruits; Distal pulses 2+ bilaterally  Cardiac:  normal S1, S2; RRR; no murmur  Lungs:  clear to auscultation bilaterally, no wheezing, rhonchi or rales  Abd: soft, nontender, no hepatomegaly  Ext: no edema Musculoskeletal:  No deformities, BUE and BLE strength normal and equal Skin: warm and dry  Neuro:  CNs 2-12 intact, no focal abnormalities noted Psych:  Normal affect    EKG:  The ECG that was done  was personally reviewed and demonstrates no ST elevations. Non-specific ST changes seen though  Relevant CV Studies:  Echo 2014:  Study Conclusions   - Left ventricle: The cavity size was normal. Wall thickness    was normal. Systolic function was normal. The estimated    ejection fraction was in the range of 55% to 60%. Wall    motion was normal; there were no regional wall motion    abnormalities. Left ventricular diastolic function    parameters were normal.  - Mitral valve: Mild regurgitation.  - Atrial septum: No defect or patent foramen ovale was    identified.   Laboratory Data:  High Sensitivity Troponin:   Recent Labs  Lab 12/23/21 2116 12/23/21 2305  TROPONINIHS 605* 1,456*      Chemistry Recent Labs  Lab 12/23/21 2116  NA 131*  K 3.7  CL 100  CO2 19*  GLUCOSE 184*  BUN 10  CREATININE 0.88  CALCIUM 9.8  GFRNONAA >60  ANIONGAP 12    No results for input(s): "PROT", "ALBUMIN", "AST", "ALT", "ALKPHOS", "BILITOT" in the last 168 hours. Lipids No results for input(s): "CHOL", "TRIG", "HDL", "LABVLDL", "LDLCALC", "CHOLHDL" in the last 168 hours. Hematology Recent Labs  Lab 12/23/21 2116  WBC 10.1  RBC 3.88  HGB 10.2*  HCT 30.8*  MCV 79.4*  MCH 26.3  MCHC 33.1  RDW 15.0  PLT 303   Thyroid No results for  input(s): "TSH", "FREET4" in the last 168 hours. BNPNo results for input(s): "BNP", "PROBNP" in the last 168 hours.  DDimer No results for input(s): "DDIMER" in the last 168 hours.   Radiology/Studies:  DG Chest Port 1 View  Result Date: 12/23/2021 CLINICAL DATA:  Chest pain. EXAM: PORTABLE CHEST 1 VIEW COMPARISON:  Chest radiograph dated 12/22/2012. FINDINGS: No focal consolidation, pleural effusion, pneumothorax. The cardiac silhouette is within normal limits. No acute osseous pathology. IMPRESSION: No active disease. Electronically Signed   By: Anner Crete M.D.   On: 12/23/2021 22:21     Assessment and Plan:   # NSTEMI  -Chest pain starting this evening with troponin in 1400s -Will load with aspirin -Start Brilinta -IV Heparin and IV Nitro for pain control -IV PPI and IV morphine PRN -Atorvastatin 80 mg -Low dose metoprolol -Echo in AM -LHC will need to be done in AM most likely rather than waiting till Monday AM.   # DM2: Hold Home medications. Insulin per protocol  # Depression: C/w home medications # GERD: IV PPI  For questions or updates, please contact [facility] Please consult [email or web address] for contact info under     Signed, Jaci Lazier, MD  12/24/2021 1:54 AM

## 2021-12-24 NOTE — Interval H&P Note (Signed)
Cath Lab Visit (complete for each Cath Lab visit)  Clinical Evaluation Leading to the Procedure:   ACS: Yes.    Non-ACS:    Anginal Classification: CCS IV  Anti-ischemic medical therapy: Minimal Therapy (1 class of medications)  Non-Invasive Test Results: No non-invasive testing performed  Prior CABG: No previous CABG      History and Physical Interval Note:  12/24/2021 11:51 AM  Jessica Bridges  has presented today for surgery, with the diagnosis of STEMI.  The various methods of treatment have been discussed with the patient and family. After consideration of risks, benefits and other options for treatment, the patient has consented to  Procedure(s): Coronary/Graft Acute MI Revascularization (N/A) LEFT HEART CATH AND CORONARY ANGIOGRAPHY (N/A) as a surgical intervention.  The patient's history has been reviewed, patient examined, no change in status, stable for surgery.  I have reviewed the patient's chart and labs.  Questions were answered to the patient's satisfaction.     Jessica Bridges

## 2021-12-24 NOTE — Progress Notes (Signed)
ANTICOAGULATION CONSULT NOTE   Pharmacy Consult for heparin Indication: chest pain/ACS  No Known Allergies  Patient Measurements: Height: '5\' 6"'$  (167.6 cm) Weight: 78.8 kg (173 lb 11.2 oz) IBW/kg (Calculated) : 59.3   Vital Signs: Temp: 98.5 F (36.9 C) (06/11 0452) Temp Source: Oral (06/11 0452) BP: 109/69 (06/11 0606) Pulse Rate: 86 (06/11 0606)  Labs: Recent Labs    12/23/21 2116 12/23/21 2305 12/24/21 0537  HGB 10.2*  --  9.4*  HCT 30.8*  --  27.6*  PLT 303  --  275  LABPROT  --   --  13.7  INR  --   --  1.1  HEPARINUNFRC  --   --  0.18*  CREATININE 0.88  --   --   TROPONINIHS 605* 1,456*  --      Estimated Creatinine Clearance: 77.4 mL/min (by C-G formula based on SCr of 0.88 mg/dL).   Medical History: Past Medical History:  Diagnosis Date   Diabetes mellitus without complication ([place])    Sleep apnea    CPAP     Assessment: 48 yoF presenting with chest pain. No anticoagulation reported prior to admission.  Pharmacy to dose heparin  Initial heparin level low: 0.18 on 900 units/hr, no issues with infusion or overt bleeding reported  Goal of Therapy:  Heparin level 0.3-0.7 units/ml Monitor platelets by anticoagulation protocol: Yes   Plan:  Increase heparin infusion to 1200 units/hr Check anti-Xa level in 6 hours and daily while on heparin Continue to monitor H&H and platelets  Georga Bora, PharmD Clinical Pharmacist 12/24/2021 6:42 AM Please check AMION for all [place] numbers

## 2021-12-24 NOTE — Interval H&P Note (Signed)
History and Physical Interval Note:  12/24/2021 11:51 AM  Jessica Bridges  has presented today for surgery, with the diagnosis of STEMI.  The various methods of treatment have been discussed with the patient and family. After consideration of risks, benefits and other options for treatment, the patient has consented to  Procedure(s): Coronary/Graft Acute MI Revascularization (N/A) LEFT HEART CATH AND CORONARY ANGIOGRAPHY (N/A) as a surgical intervention.  The patient's history has been reviewed, patient examined, no change in status, stable for surgery.  I have reviewed the patient's chart and labs.  Questions were answered to the patient's satisfaction.     [name]

## 2021-12-24 NOTE — H&P (View-Only) (Signed)
Patient seen today.  She presented to the hospital with chest pain, found to have troponin elevation consistent with non-STEMI.  She is currently on aspirin, Brilinta, heparin.  She is continued to have pain despite IV nitroglycerin and morphine.  Due to that, we Jessica Bridges plan for left heart catheterization urgently today.  Risk and benefits have been discussed.  She understands the risks and is agreed to the procedure.  The patient understands that risks include but are not limited to stroke (1 in 1000), death (1 in 28), kidney failure [usually temporary] (1 in 500), bleeding (1 in 200), allergic reaction [possibly serious] (1 in 200), and agrees to proceed.   Allegra Lai, MD

## 2021-12-24 NOTE — Progress Notes (Signed)
Patient started complaining of mid sternal chest pain and indigestion again. I gave morphine, EKG done, MD made aware and came to bedside. Plans as ordered.

## 2021-12-24 NOTE — Progress Notes (Signed)
   12/24/21 1108  Clinical Encounter Type  Visited With Patient and family together (Husband: [name])  Visit Type Initial;Code;Pre-op (STEMI)  Referral From Physician (Dr. Burt Knack)  Consult/Referral To Chaplain Halina Andreas Shayleen Eppinger)  Recommendations CODE STEMI - [phone]   Paged to Code STEMI room [phone].  Met Mrs. Jessica Bridges and her husband, Mr. Jessica Bridges, at patient's bedside.  Introduced self and Investment banker, operational available here at [facility]. [address] [place], [place] [phone].

## 2021-12-24 NOTE — Progress Notes (Signed)
Patient seen today.  She presented to the hospital with chest pain, found to have troponin elevation consistent with non-STEMI.  She is currently on aspirin, Brilinta, heparin.  She is continued to have pain despite IV nitroglycerin and morphine.  Due to that, we Rudolph Dobler plan for left heart catheterization urgently today.  Risk and benefits have been discussed.  She understands the risks and is agreed to the procedure.  The patient understands that risks include but are not limited to stroke (1 in 1000), death (1 in 41), kidney failure [usually temporary] (1 in 500), bleeding (1 in 200), allergic reaction [possibly serious] (1 in 200), and agrees to proceed.   Allegra Lai, MD

## 2021-12-25 ENCOUNTER — Other Ambulatory Visit (HOSPITAL_COMMUNITY): Payer: Self-pay

## 2021-12-25 ENCOUNTER — Encounter (HOSPITAL_COMMUNITY): Payer: Self-pay | Admitting: Cardiovascular Disease

## 2021-12-25 DIAGNOSIS — I214 Non-ST elevation (NSTEMI) myocardial infarction: Secondary | ICD-10-CM | POA: Diagnosis not present

## 2021-12-25 LAB — GLUCOSE, CAPILLARY
Glucose-Capillary: 134 mg/dL — ABNORMAL HIGH (ref 70–99)
Glucose-Capillary: 174 mg/dL — ABNORMAL HIGH (ref 70–99)
Glucose-Capillary: 128 mg/dL — ABNORMAL HIGH (ref 70–99)
Glucose-Capillary: 174 mg/dL — ABNORMAL HIGH (ref 70–99)

## 2021-12-25 LAB — CBC
HCT: 28.3 % — ABNORMAL LOW (ref 36.0–46.0)
Hemoglobin: 9.4 g/dL — ABNORMAL LOW (ref 12.0–15.0)
MCH: 26.9 pg (ref 26.0–34.0)
MCHC: 33.2 g/dL (ref 30.0–36.0)
MCV: 81.1 fL (ref 80.0–100.0)
Platelets: 235 10*3/uL (ref 150–400)
RBC: 3.49 MIL/uL — ABNORMAL LOW (ref 3.87–5.11)
RDW: 15.1 % (ref 11.5–15.5)
WBC: 6.8 10*3/uL (ref 4.0–10.5)
nRBC: 0 % (ref 0.0–0.2)

## 2021-12-25 LAB — LIPOPROTEIN A (LPA): Lipoprotein (a): 64.7 nmol/L — ABNORMAL HIGH (ref <75.0)

## 2021-12-25 MED ORDER — ASPIRIN 81 MG PO TBEC
81.0000 mg | DELAYED_RELEASE_TABLET | Freq: Every day | ORAL | Status: DC
  Administered 2021-12-25 – 2021-12-26 (×2): 81 mg via ORAL
  Filled 2021-12-25 (×2): qty 1

## 2021-12-25 MED ORDER — PANTOPRAZOLE SODIUM 40 MG PO TBEC
40.0000 mg | DELAYED_RELEASE_TABLET | Freq: Two times a day (BID) | ORAL | Status: DC
  Administered 2021-12-25 – 2021-12-26 (×3): 40 mg via ORAL
  Filled 2021-12-25 (×3): qty 1

## 2021-12-25 MED ORDER — ROSUVASTATIN CALCIUM 20 MG PO TABS
20.0000 mg | ORAL_TABLET | Freq: Every day | ORAL | Status: DC
  Administered 2021-12-25 – 2021-12-26 (×2): 20 mg via ORAL
  Filled 2021-12-25 (×2): qty 1

## 2021-12-25 MED ORDER — METOPROLOL TARTRATE 12.5 MG HALF TABLET
12.5000 mg | ORAL_TABLET | Freq: Two times a day (BID) | ORAL | Status: DC
  Administered 2021-12-25 – 2021-12-26 (×3): 12.5 mg via ORAL
  Filled 2021-12-25 (×3): qty 1

## 2021-12-25 NOTE — Progress Notes (Signed)
  Transition of Care [facility]) Screening Note   Patient Details  Name: Jessica Bridges Date of Birth: 01-Apr-1966   Transition of Care [facility]) CM/SW Contact:    Milas Gain, [place] Phone Number: 12/25/2021, 5:10 PM    Transition of Care Department [facility]) has reviewed patient and no TOC needs have been identified at this time. We will continue to monitor patient advancement through interdisciplinary progression rounds. If new patient transition needs arise, please place a TOC consult.

## 2021-12-25 NOTE — Progress Notes (Addendum)
Progress Note  Patient Name: Jessica Bridges Date of Encounter: 12/25/2021  [facility] Cardiologist: None   Subjective   No recurrent chest pain.   Inpatient Medications    Scheduled Meds:  atorvastatin  80 mg Oral Daily   enoxaparin (LOVENOX) injection  40 mg Subcutaneous Q24H   ezetimibe  10 mg Oral Daily   insulin aspart  0-9 Units Subcutaneous TID WC   lamoTRIgine  100 mg Oral Daily   loratadine  10 mg Oral QPM   metoprolol tartrate  25 mg Oral BID   montelukast  10 mg Oral QHS   pantoprazole  40 mg Oral BID   sodium chloride flush  3 mL Intravenous Q12H   vortioxetine HBr  10 mg Oral Daily   Continuous Infusions:  sodium chloride     PRN Meds: sodium chloride, acetaminophen, morphine injection, nitroGLYCERIN, ondansetron (ZOFRAN) IV, sodium chloride flush   Vital Signs    Vitals:   12/24/21 2113 12/25/21 0547 12/25/21 0727 12/25/21 0920  BP: 111/63 (!) 93/58 100/66 (!) 90/52  Pulse: 81 85 76 78  Resp: '16 17 16   '$ Temp: 98 F (36.7 C) 98.2 F (36.8 C) 98.2 F (36.8 C)   TempSrc: Oral Oral Oral   SpO2: 99% 97% 98%   Weight:      Height:       No intake or output data in the 24 hours ending 12/25/21 1008    12/24/2021   12:51 AM 12/23/2021   10:40 PM 05/16/2017    8:25 AM  Last 3 Weights  Weight (lbs) 173 lb 11.2 oz 171 lb 185 lb  Weight (kg) 78.79 kg 77.565 kg 83.915 kg      Telemetry    SR - Personally Reviewed  ECG    SR, 83 bpm - Personally Reviewed  Physical Exam   GEN: No acute distress.   Neck: No JVD Cardiac: RRR, no murmurs, rubs, or gallops.  Respiratory: Clear to auscultation bilaterally. GI: Soft, nontender, non-distended  MS: No edema; No deformity. Right radial cath site stable. Neuro:  Nonfocal  Psych: Normal affect   Labs    High Sensitivity Troponin:   Recent Labs  Lab 12/23/21 2116 12/23/21 2305 12/24/21 0537 12/24/21 0735  TROPONINIHS 605* 1,456* 1,500* 1,083*     Chemistry Recent Labs  Lab  12/23/21 2116 12/24/21 0537  NA 131* 133*  K 3.7 3.5  CL 100 101  CO2 19* 22  GLUCOSE 184* 111*  BUN 10 7  CREATININE 0.88 0.88  CALCIUM 9.8 8.6*  GFRNONAA >60 >60  ANIONGAP 12 10    Lipids  Recent Labs  Lab 12/24/21 0537  CHOL 133  TRIG 120  HDL 66  LDLCALC 43  CHOLHDL 2.0    Hematology Recent Labs  Lab 12/23/21 2116 12/24/21 0537 12/25/21 0058  WBC 10.1 9.0 6.8  RBC 3.88 3.43* 3.49*  HGB 10.2* 9.4* 9.4*  HCT 30.8* 27.6* 28.3*  MCV 79.4* 80.5 81.1  MCH 26.3 27.4 26.9  MCHC 33.1 34.1 33.2  RDW 15.0 15.1 15.1  PLT 303 275 235   Thyroid No results for input(s): "TSH", "FREET4" in the last 168 hours.  BNPNo results for input(s): "BNP", "PROBNP" in the last 168 hours.  DDimer No results for input(s): "DDIMER" in the last 168 hours.   Radiology    CARDIAC CATHETERIZATION  Result Date: 12/24/2021 1.  Widely patent left main 2.  Patent LAD with mild nonobstructive mid vessel plaquing 3.  Angiographically  normal left circumflex 4.  Normal LVEDP Recommendations: The patient has mild nonobstructive coronary artery disease.  I suspect she is having a non-coronary event such as atypical stress-induced event like acute Takotsubo syndrome.  I reviewed her echo and her wall motion abnormality is focal involving the mid-septum.  It does not appear to follow any typical coronary distribution.  Recommend medical therapy.   ECHOCARDIOGRAM COMPLETE  Result Date: 12/24/2021    ECHOCARDIOGRAM REPORT   Patient Name:   Jessica Bridges Date of Exam: 12/24/2021 Medical Rec #:  [id number]          Height:       66.0 in Accession #:    [id number]         Weight:       173.7 lb Date of Birth:  1966/05/25          BSA:          1.883 m Patient Age:    56 years           BP:           109/64 mmHg Patient Gender: F                  HR:           83 bpm. Exam Location:  Inpatient Procedure: 2D Echo, Cardiac Doppler and Color Doppler Indications:    Acute ischemic heart disease  History:         Patient has prior history of Echocardiogram examinations, most                 recent 09/30/2012.  Sonographer:    Jefferey Pica Referring Phys: [id number] [place]  1. Akinesis of the midseptum with overall mild LV dysfunction.  2. Left ventricular ejection fraction, by estimation, is 45 to 50%. The left ventricle has mildly decreased function. The left ventricle demonstrates regional wall motion abnormalities (see scoring diagram/findings for description). Left ventricular diastolic parameters are consistent with Grade I diastolic dysfunction (impaired relaxation).  3. Right ventricular systolic function is normal. The right ventricular size is normal. Tricuspid regurgitation signal is inadequate for assessing PA pressure.  4. The mitral valve is normal in structure. Trivial mitral valve regurgitation. No evidence of mitral stenosis.  5. The aortic valve is tricuspid. Aortic valve regurgitation is not visualized. No aortic stenosis is present.  6. The inferior vena cava is normal in size with greater than 50% respiratory variability, suggesting right atrial pressure of 3 mmHg. FINDINGS  Left Ventricle: Left ventricular ejection fraction, by estimation, is 45 to 50%. The left ventricle has mildly decreased function. The left ventricle demonstrates regional wall motion abnormalities. The left ventricular internal cavity size was normal in size. There is no left ventricular hypertrophy. Left ventricular diastolic parameters are consistent with Grade I diastolic dysfunction (impaired relaxation). Right Ventricle: The right ventricular size is normal. Right ventricular systolic function is normal. Tricuspid regurgitation signal is inadequate for assessing PA pressure. The tricuspid regurgitant velocity is 2.17 m/s, and with an assumed right atrial  pressure of 5 mmHg, the estimated right ventricular systolic pressure is 95.0 mmHg. Left Atrium: Left atrial size was normal in size. Right Atrium: Right  atrial size was normal in size. Pericardium: There is no evidence of pericardial effusion. Mitral Valve: The mitral valve is normal in structure. Trivial mitral valve regurgitation. No evidence of mitral valve stenosis. Tricuspid Valve: The tricuspid valve is normal in structure. Tricuspid valve regurgitation is  trivial. No evidence of tricuspid stenosis. Aortic Valve: The aortic valve is tricuspid. Aortic valve regurgitation is not visualized. No aortic stenosis is present. Aortic valve peak gradient measures 6.0 mmHg. Pulmonic Valve: The pulmonic valve was normal in structure. Pulmonic valve regurgitation is not visualized. No evidence of pulmonic stenosis. Aorta: The aortic root is normal in size and structure. Venous: The inferior vena cava is normal in size with greater than 50% respiratory variability, suggesting right atrial pressure of 3 mmHg. IAS/Shunts: No atrial level shunt detected by color flow Doppler. Additional Comments: Akinesis of the midseptum with overall mild LV dysfunction.  LEFT VENTRICLE PLAX 2D LVIDd:         3.00 cm      Diastology LVIDs:         4.20 cm      LV e' medial:    5.08 cm/s LV PW:         0.70 cm      LV E/e' medial:  13.1 LV IVS:        2.80 cm      LV e' lateral:   6.85 cm/s LVOT diam:     2.00 cm      LV E/e' lateral: 9.7 LV SV:         81 LV SV Index:   43 LVOT Area:     3.14 cm  LV Volumes (MOD) LV vol d, MOD A4C: 101.0 ml LV vol s, MOD A4C: 59.5 ml LV SV MOD A4C:     101.0 ml RIGHT VENTRICLE             IVC RV Basal diam:  2.70 cm     IVC diam: 1.60 cm RV S prime:     11.70 cm/s TAPSE (M-mode): 2.3 cm LEFT ATRIUM             Index        RIGHT ATRIUM           Index LA diam:        4.00 cm 2.12 cm/m   RA Area:     11.70 cm LA Vol (A2C):   49.5 ml 26.28 ml/m  RA Volume:   25.50 ml  13.54 ml/m LA Vol (A4C):   55.2 ml 29.31 ml/m LA Biplane Vol: 52.2 ml 27.71 ml/m  AORTIC VALVE                 PULMONIC VALVE AV Area (Vmax): 3.13 cm     PV Vmax:       1.19 m/s AV Vmax:         122.50 cm/s  PV Peak grad:  5.7 mmHg AV Peak Grad:   6.0 mmHg LVOT Vmax:      122.00 cm/s LVOT Vmean:     84.700 cm/s LVOT VTI:       0.257 m  AORTA Ao Root diam: 3.25 cm Ao Asc diam:  3.20 cm MITRAL VALVE                TRICUSPID VALVE MV Area (PHT): 5.38 cm     TR Peak grad:   18.8 mmHg MV Decel Time: 141 msec     TR Vmax:        217.00 cm/s MV E velocity: 66.70 cm/s MV A velocity: 108.00 cm/s  SHUNTS MV E/A ratio:  0.62         Systemic VTI:  0.26 m  Systemic Diam: 2.00 cm Kirk Ruths MD Electronically signed by Kirk Ruths MD Signature Date/Time: 12/24/2021/12:03:37 PM    Final    DG Chest Port 1 View  Result Date: 12/23/2021 CLINICAL DATA:  Chest pain. EXAM: PORTABLE CHEST 1 VIEW COMPARISON:  Chest radiograph dated 12/22/2012. FINDINGS: No focal consolidation, pleural effusion, pneumothorax. The cardiac silhouette is within normal limits. No acute osseous pathology. IMPRESSION: No active disease. Electronically Signed   By: Anner Crete M.D.   On: 12/23/2021 22:21    Cardiac Studies   Cath: 12/24/21  1.  Widely patent left main 2.  Patent LAD with mild nonobstructive mid vessel plaquing 3.  Angiographically normal left circumflex 4.  Normal LVEDP  Recommendations: The patient has mild nonobstructive coronary artery disease.  I suspect she is having a non-coronary event such as atypical stress-induced event like acute Takotsubo syndrome.  I reviewed her echo and her wall motion abnormality is focal involving the mid-septum.  It does not appear to follow any typical coronary distribution.  Recommend medical therapy.  IMPRESSIONS     1. Akinesis of the midseptum with overall mild LV dysfunction.   2. Left ventricular ejection fraction, by estimation, is 45 to 50%. The  left ventricle has mildly decreased function. The left ventricle  demonstrates regional wall motion abnormalities (see scoring  diagram/findings for description). Left ventricular   diastolic parameters are consistent with Grade I diastolic dysfunction  (impaired relaxation).   3. Right ventricular systolic function is normal. The right ventricular  size is normal. Tricuspid regurgitation signal is inadequate for assessing  PA pressure.   4. The mitral valve is normal in structure. Trivial mitral valve  regurgitation. No evidence of mitral stenosis.   5. The aortic valve is tricuspid. Aortic valve regurgitation is not  visualized. No aortic stenosis is present.   6. The inferior vena cava is normal in size with greater than 50%  respiratory variability, suggesting right atrial pressure of 3 mmHg.   FINDINGS   Left Ventricle: Left ventricular ejection fraction, by estimation, is 45  to 50%. The left ventricle has mildly decreased function. The left  ventricle demonstrates regional wall motion abnormalities. The left  ventricular internal cavity size was normal  in size. There is no left ventricular hypertrophy. Left ventricular  diastolic parameters are consistent with Grade I diastolic dysfunction  (impaired relaxation).   Right Ventricle: The right ventricular size is normal. Right ventricular  systolic function is normal. Tricuspid regurgitation signal is inadequate  for assessing PA pressure. The tricuspid regurgitant velocity is 2.17 m/s,  and with an assumed right atrial   pressure of 5 mmHg, the estimated right ventricular systolic pressure is  07.3 mmHg.   Left Atrium: Left atrial size was normal in size.   Right Atrium: Right atrial size was normal in size.   Pericardium: There is no evidence of pericardial effusion.   Mitral Valve: The mitral valve is normal in structure. Trivial mitral  valve regurgitation. No evidence of mitral valve stenosis.   Tricuspid Valve: The tricuspid valve is normal in structure. Tricuspid  valve regurgitation is trivial. No evidence of tricuspid stenosis.   Aortic Valve: The aortic valve is tricuspid. Aortic valve  regurgitation is  not visualized. No aortic stenosis is present. Aortic valve peak gradient  measures 6.0 mmHg.   Pulmonic Valve: The pulmonic valve was normal in structure. Pulmonic valve  regurgitation is not visualized. No evidence of pulmonic stenosis.   Aorta: The aortic root is normal in  size and structure.   Venous: The inferior vena cava is normal in size with greater than 50%  respiratory variability, suggesting right atrial pressure of 3 mmHg.   IAS/Shunts: No atrial level shunt detected by color flow Doppler.   Additional Comments: Akinesis of the midseptum with overall mild LV  dysfunction.   Patient Profile     56 y.o. female  with DM, GERD and Depression who was seen 12/24/2021 for the evaluation of NSTEMI  Assessment & Plan    NSTEMI/stress cardiomyopathy: hsTn peaked at 1500, underwent cardiac cath noted above with patent, nonobs disease. It is felt symptoms are more consistent with atypical stress induced cardiomyopathy. No recurrent chest pain.  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 Burt Knack revealing nonobstructive disease.  She did have 40 to 50% ostial/proximal first diagonal branch stenosis.  She had mid septal akinesis of unclear etiology thought to be potentially a "Takotsubo" variant.  Her troponins did go up consistent with non-STEMI.  She is currently asymptomatic.  Plan to treat with low-dose aspirin, low-dose beta-blocker and statin  drug.  Anticipate discharge tomorrow.  Lorretta Harp, M.D., [place], [facility], [place] [address]. [place], [place]  [id number]  [phone] 12/25/2021 12:23 PM

## 2021-12-25 NOTE — TOC Benefit Eligibility Note (Signed)
Patient Research scientist (life sciences) completed.     The patient is currently admitted and upon discharge could be taking farxiga 10 mg.   The current 30 day co-pay is, requiring prior auth.   The patient is currently admitted and upon discharge could be taking jardiance 10 mg.   The current 30 day co-pay is, $24.   The patient is insured through [employer].

## 2021-12-26 ENCOUNTER — Other Ambulatory Visit (HOSPITAL_COMMUNITY): Payer: Self-pay

## 2021-12-26 DIAGNOSIS — I214 Non-ST elevation (NSTEMI) myocardial infarction: Secondary | ICD-10-CM | POA: Diagnosis not present

## 2021-12-26 DIAGNOSIS — I219 Acute myocardial infarction, unspecified: Secondary | ICD-10-CM

## 2021-12-26 DIAGNOSIS — I5181 Takotsubo syndrome: Secondary | ICD-10-CM

## 2021-12-26 DIAGNOSIS — E119 Type 2 diabetes mellitus without complications: Secondary | ICD-10-CM

## 2021-12-26 DIAGNOSIS — Z9884 Bariatric surgery status: Secondary | ICD-10-CM

## 2021-12-26 HISTORY — DX: Acute myocardial infarction, unspecified: I21.9

## 2021-12-26 LAB — CBC
HCT: 29 % — ABNORMAL LOW (ref 36.0–46.0)
Hemoglobin: 9.5 g/dL — ABNORMAL LOW (ref 12.0–15.0)
MCH: 27 pg (ref 26.0–34.0)
MCHC: 32.8 g/dL (ref 30.0–36.0)
MCV: 82.4 fL (ref 80.0–100.0)
Platelets: 238 10*3/uL (ref 150–400)
RBC: 3.52 MIL/uL — ABNORMAL LOW (ref 3.87–5.11)
RDW: 15 % (ref 11.5–15.5)
WBC: 7.4 10*3/uL (ref 4.0–10.5)
nRBC: 0 % (ref 0.0–0.2)

## 2021-12-26 LAB — GLUCOSE, CAPILLARY
Glucose-Capillary: 178 mg/dL — ABNORMAL HIGH (ref 70–99)
Glucose-Capillary: 102 mg/dL — ABNORMAL HIGH (ref 70–99)

## 2021-12-26 MED ORDER — ASPIRIN 81 MG PO TBEC
81.0000 mg | DELAYED_RELEASE_TABLET | Freq: Every day | ORAL | 3 refills | Status: DC
  Filled 2021-12-26: qty 90, 90d supply, fill #0

## 2021-12-26 MED ORDER — FLUCONAZOLE 150 MG PO TABS: 150.0000 mg | ORAL_TABLET | ORAL | Status: DC | PRN

## 2021-12-26 MED ORDER — ROSUVASTATIN CALCIUM 20 MG PO TABS
20.0000 mg | ORAL_TABLET | Freq: Every day | ORAL | 11 refills | Status: DC
  Filled 2021-12-26: qty 30, 30d supply, fill #0

## 2021-12-26 NOTE — Progress Notes (Signed)
CARDIAC REHAB PHASE I   PRE:  Rate/Rhythm: 71 NSR  BP:  Sitting: 89/53      SaO2: 99 RA  MODE:  Ambulation: 470 ft   POST:  Rate/Rhythm: 88 NSR  BP:  Sitting: 120/67      SaO2: 99 RA   Seen pt from [phone] pt was ambulated through hallways with no hands on assistance . Pt walked well and was asymptomatic during ambulation. Pt was returned to bed and educated on MI, restrictions, ex guidelines, NTG use, heart healthy diet, diabetic diet, and CRPII. Pt is being referred to CRPII in [place].    Christen Bame  8:58 AM 12/26/2021

## 2021-12-26 NOTE — Discharge Summary (Addendum)
Discharge Summary    Patient ID: Nilam Quakenbush Malmstrom MRN: [id number]; DOB: June 03, 1966  Admit date: 12/23/2021 Discharge date: 12/26/2021  PCP:  Fanny Bien, MD   [facility] Providers Cardiologist:  Quay Burow, MD      Discharge Diagnoses    Principal Problem:   NSTEMI (non-ST elevated myocardial infarction) [facility]) Active Problems:   Stress-induced cardiomyopathy   Gastric bypass status for obesity   Type 2 diabetes mellitus without complication, with long-term current use of insulin [facility])  Diagnostic Studies/Procedures    Cath: 12/24/21   1.  Widely patent left main 2.  Patent LAD with mild nonobstructive mid vessel plaquing 3.  Angiographically normal left circumflex 4.  Normal LVEDP  Recommendations: The patient has mild nonobstructive coronary artery disease.  I suspect she is having a non-coronary event such as atypical stress-induced event like acute Takotsubo syndrome.  I reviewed her echo and her wall motion abnormality is focal involving the mid-septum.  It does not appear to follow any typical coronary distribution.  Recommend medical therapy.  Echo: 12/24/21   IMPRESSIONS     1. Akinesis of the midseptum with overall mild LV dysfunction.   2. Left ventricular ejection fraction, by estimation, is 45 to 50%. The  left ventricle has mildly decreased function. The left ventricle  demonstrates regional wall motion abnormalities (see scoring  diagram/findings for description). Left ventricular  diastolic parameters are consistent with Grade I diastolic dysfunction  (impaired relaxation).   3. Right ventricular systolic function is normal. The right ventricular  size is normal. Tricuspid regurgitation signal is inadequate for assessing  PA pressure.   4. The mitral valve is normal in structure. Trivial mitral valve  regurgitation. No evidence of mitral stenosis.   5. The aortic valve is tricuspid. Aortic valve regurgitation is not  visualized. No  aortic stenosis is present.   6. The inferior vena cava is normal in size with greater than 50%  respiratory variability, suggesting right atrial pressure of 3 mmHg.   FINDINGS   Left Ventricle: Left ventricular ejection fraction, by estimation, is 45  to 50%. The left ventricle has mildly decreased function. The left  ventricle demonstrates regional wall motion abnormalities. The left  ventricular internal cavity size was normal  in size. There is no left ventricular hypertrophy. Left ventricular  diastolic parameters are consistent with Grade I diastolic dysfunction  (impaired relaxation).   Right Ventricle: The right ventricular size is normal. Right ventricular  systolic function is normal. Tricuspid regurgitation signal is inadequate  for assessing PA pressure. The tricuspid regurgitant velocity is 2.17 m/s,  and with an assumed right atrial   pressure of 5 mmHg, the estimated right ventricular systolic pressure is  40.9 mmHg.   Left Atrium: Left atrial size was normal in size.   Right Atrium: Right atrial size was normal in size.   Pericardium: There is no evidence of pericardial effusion.   Mitral Valve: The mitral valve is normal in structure. Trivial mitral  valve regurgitation. No evidence of mitral valve stenosis.   Tricuspid Valve: The tricuspid valve is normal in structure. Tricuspid  valve regurgitation is trivial. No evidence of tricuspid stenosis.   Aortic Valve: The aortic valve is tricuspid. Aortic valve regurgitation is  not visualized. No aortic stenosis is present. Aortic valve peak gradient  measures 6.0 mmHg.   Pulmonic Valve: The pulmonic valve was normal in structure. Pulmonic valve  regurgitation is not visualized. No evidence of pulmonic stenosis.   Aorta: The aortic  root is normal in size and structure.   Venous: The inferior vena cava is normal in size with greater than 50%  respiratory variability, suggesting right atrial pressure of 3 mmHg.    IAS/Shunts: No atrial level shunt detected by color flow Doppler.   Additional Comments: Akinesis of the midseptum with overall mild LV  dysfunction.  _____________   History of Present Illness     Venna Berberich is a 56 y.o. female with  DM, GERD, gastric bypass and Depression who was seen 12/24/2021 for the evaluation of NSTEMI. She stated she had been in usual health till the morning of admission when she got nauseous. Initially she ignored it but around 7 pm she had some chest pressure with heartburn symptoms in to her throat hence came to the ED where her troponin were 500 and then 1400. [facility]. Here, her pain was stable but still present. She said it was a burning in her throat. No radiation. VS stable. No numbness or weakness. She denied tobacco alcohol or any cocaine. Denied SOB. She was started on IV heparin and IV nitroglycerin.  No prior history of cardiac issues.    Hospital Course     NSTEMI/stress cardiomyopathy: hsTn peaked at 1500, underwent cardiac cath noted above with patent, nonobs disease. It is felt symptoms are more consistent with atypical stress induced cardiomyopathy.  -- continue ASA (likely EC, reports hx of gastric bypass) statin -- seen by CR and ambulated without recurrent chest pain -- attempted to add low dose BB but blood pressures unable to tolerate, therefore was stopped. Consider addition at outpatient follow up if tolerated   DM: Hgb A1c 7.0 -- resumed on PTA metformin, insulin, mounjaro    Depression GERD Hx of gastric bypass  Patient was seen by Dr. Gwenlyn Found and deemed stable for discharge home. Follow up in the office has been arranged. Medications sent to [place]. Educated by PharmD.   Did the patient have an acute coronary syndrome (MI, NSTEMI, STEMI, etc) this admission?:  Yes                               AHA/ACC Clinical Performance & Quality Measures: Aspirin prescribed? - Yes ADP Receptor Inhibitor  (Plavix/Clopidogrel, Brilinta/Ticagrelor or Effient/Prasugrel) prescribed (includes medically managed patients)? - No - monotherapy with ASA, no PCI hx of gastric bypass Beta Blocker prescribed? - No - blood pressure to soft High Intensity Statin (Lipitor 40-'80mg'$  or Crestor 20-'40mg'$ ) prescribed? - Yes EF assessed during THIS hospitalization? - Yes For EF <40%, was ACEI/ARB prescribed? - Not Applicable (EF >/= 19%) For EF <40%, Aldosterone Antagonist (Spironolactone or Eplerenone) prescribed? - Not Applicable (EF >/= 37%) Cardiac Rehab Phase II ordered (including medically managed patients)? - Yes       The patient will be scheduled for a TOC follow up appointment in 10-14 days.  A message has been sent to the [facility] and Scheduling Pool at the office where the patient should be seen for follow up.  _____________  Discharge Vitals Blood pressure (!) 89/53, pulse 74, temperature 98.1 F (36.7 C), temperature source Oral, resp. rate 16, height '5\' 6"'$  (1.676 m), weight 78.8 kg, SpO2 100 %.  Filed Weights   12/23/21 2240 12/24/21 0051  Weight: 77.6 kg 78.8 kg    Labs & Radiologic Studies    CBC Recent Labs    12/25/21 0058 12/26/21 0231  WBC 6.8  7.4  HGB 9.4* 9.5*  HCT 28.3* 29.0*  MCV 81.1 82.4  PLT 235 242   Basic Metabolic Panel Recent Labs    12/23/21 2116 12/24/21 0537  NA 131* 133*  K 3.7 3.5  CL 100 101  CO2 19* 22  GLUCOSE 184* 111*  BUN 10 7  CREATININE 0.88 0.88  CALCIUM 9.8 8.6*   Liver Function Tests No results for input(s): "AST", "ALT", "ALKPHOS", "BILITOT", "PROT", "ALBUMIN" in the last 72 hours. No results for input(s): "LIPASE", "AMYLASE" in the last 72 hours. High Sensitivity Troponin:   Recent Labs  Lab 12/23/21 2116 12/23/21 2305 12/24/21 0537 12/24/21 0735  TROPONINIHS 605* 1,456* 1,500* 1,083*    BNP Invalid input(s): "POCBNP" D-Dimer No results for input(s): "DDIMER" in the last 72 hours. Hemoglobin A1C Recent Labs    12/24/21 0537   HGBA1C 7.0*   Fasting Lipid Panel Recent Labs    12/24/21 0537  CHOL 133  HDL 66  LDLCALC 43  TRIG 120  CHOLHDL 2.0   Thyroid Function Tests No results for input(s): "TSH", "T4TOTAL", "T3FREE", "THYROIDAB" in the last 72 hours.  Invalid input(s): "FREET3" _____________  CARDIAC CATHETERIZATION  Result Date: 12/24/2021 1.  Widely patent left main 2.  Patent LAD with mild nonobstructive mid vessel plaquing 3.  Angiographically normal left circumflex 4.  Normal LVEDP Recommendations: The patient has mild nonobstructive coronary artery disease.  I suspect she is having a non-coronary event such as atypical stress-induced event like acute Takotsubo syndrome.  I reviewed her echo and her wall motion abnormality is focal involving the mid-septum.  It does not appear to follow any typical coronary distribution.  Recommend medical therapy.   ECHOCARDIOGRAM COMPLETE  Result Date: 12/24/2021    ECHOCARDIOGRAM REPORT   Patient Name:   JAALIYAH LUCATERO Schlack Date of Exam: 12/24/2021 Medical Rec #:  [id number]          Height:       66.0 in Accession #:    [id number]         Weight:       173.7 lb Date of Birth:  05-14-1966          BSA:          1.883 m Patient Age:    89 years           BP:           109/64 mmHg Patient Gender: F                  HR:           83 bpm. Exam Location:  Inpatient Procedure: 2D Echo, Cardiac Doppler and Color Doppler Indications:    Acute ischemic heart disease  History:        Patient has prior history of Echocardiogram examinations, most                 recent 09/30/2012.  Sonographer:    Jefferey Pica Referring Phys: [id number] [place]  1. Akinesis of the midseptum with overall mild LV dysfunction.  2. Left ventricular ejection fraction, by estimation, is 45 to 50%. The left ventricle has mildly decreased function. The left ventricle demonstrates regional wall motion abnormalities (see scoring diagram/findings for description). Left ventricular diastolic  parameters are consistent with Grade I diastolic dysfunction (impaired relaxation).  3. Right ventricular systolic function is normal. The right ventricular size is normal. Tricuspid regurgitation signal is inadequate for assessing PA pressure.  4.  The mitral valve is normal in structure. Trivial mitral valve regurgitation. No evidence of mitral stenosis.  5. The aortic valve is tricuspid. Aortic valve regurgitation is not visualized. No aortic stenosis is present.  6. The inferior vena cava is normal in size with greater than 50% respiratory variability, suggesting right atrial pressure of 3 mmHg. FINDINGS  Left Ventricle: Left ventricular ejection fraction, by estimation, is 45 to 50%. The left ventricle has mildly decreased function. The left ventricle demonstrates regional wall motion abnormalities. The left ventricular internal cavity size was normal in size. There is no left ventricular hypertrophy. Left ventricular diastolic parameters are consistent with Grade I diastolic dysfunction (impaired relaxation). Right Ventricle: The right ventricular size is normal. Right ventricular systolic function is normal. Tricuspid regurgitation signal is inadequate for assessing PA pressure. The tricuspid regurgitant velocity is 2.17 m/s, and with an assumed right atrial  pressure of 5 mmHg, the estimated right ventricular systolic pressure is 40.9 mmHg. Left Atrium: Left atrial size was normal in size. Right Atrium: Right atrial size was normal in size. Pericardium: There is no evidence of pericardial effusion. Mitral Valve: The mitral valve is normal in structure. Trivial mitral valve regurgitation. No evidence of mitral valve stenosis. Tricuspid Valve: The tricuspid valve is normal in structure. Tricuspid valve regurgitation is trivial. No evidence of tricuspid stenosis. Aortic Valve: The aortic valve is tricuspid. Aortic valve regurgitation is not visualized. No aortic stenosis is present. Aortic valve peak gradient  measures 6.0 mmHg. Pulmonic Valve: The pulmonic valve was normal in structure. Pulmonic valve regurgitation is not visualized. No evidence of pulmonic stenosis. Aorta: The aortic root is normal in size and structure. Venous: The inferior vena cava is normal in size with greater than 50% respiratory variability, suggesting right atrial pressure of 3 mmHg. IAS/Shunts: No atrial level shunt detected by color flow Doppler. Additional Comments: Akinesis of the midseptum with overall mild LV dysfunction.  LEFT VENTRICLE PLAX 2D LVIDd:         3.00 cm      Diastology LVIDs:         4.20 cm      LV e' medial:    5.08 cm/s LV PW:         0.70 cm      LV E/e' medial:  13.1 LV IVS:        2.80 cm      LV e' lateral:   6.85 cm/s LVOT diam:     2.00 cm      LV E/e' lateral: 9.7 LV SV:         81 LV SV Index:   43 LVOT Area:     3.14 cm  LV Volumes (MOD) LV vol d, MOD A4C: 101.0 ml LV vol s, MOD A4C: 59.5 ml LV SV MOD A4C:     101.0 ml RIGHT VENTRICLE             IVC RV Basal diam:  2.70 cm     IVC diam: 1.60 cm RV S prime:     11.70 cm/s TAPSE (M-mode): 2.3 cm LEFT ATRIUM             Index        RIGHT ATRIUM           Index LA diam:        4.00 cm 2.12 cm/m   RA Area:     11.70 cm LA Vol (A2C):   49.5 ml 26.28 ml/m  RA  Volume:   25.50 ml  13.54 ml/m LA Vol (A4C):   55.2 ml 29.31 ml/m LA Biplane Vol: 52.2 ml 27.71 ml/m  AORTIC VALVE                 PULMONIC VALVE AV Area (Vmax): 3.13 cm     PV Vmax:       1.19 m/s AV Vmax:        122.50 cm/s  PV Peak grad:  5.7 mmHg AV Peak Grad:   6.0 mmHg LVOT Vmax:      122.00 cm/s LVOT Vmean:     84.700 cm/s LVOT VTI:       0.257 m  AORTA Ao Root diam: 3.25 cm Ao Asc diam:  3.20 cm MITRAL VALVE                TRICUSPID VALVE MV Area (PHT): 5.38 cm     TR Peak grad:   18.8 mmHg MV Decel Time: 141 msec     TR Vmax:        217.00 cm/s MV E velocity: 66.70 cm/s MV A velocity: 108.00 cm/s  SHUNTS MV E/A ratio:  0.62         Systemic VTI:  0.26 m                             Systemic Diam:  2.00 cm Kirk Ruths MD Electronically signed by Kirk Ruths MD Signature Date/Time: 12/24/2021/12:03:37 PM    Final    DG Chest Port 1 View  Result Date: 12/23/2021 CLINICAL DATA:  Chest pain. EXAM: PORTABLE CHEST 1 VIEW COMPARISON:  Chest radiograph dated 12/22/2012. FINDINGS: No focal consolidation, pleural effusion, pneumothorax. The cardiac silhouette is within normal limits. No acute osseous pathology. IMPRESSION: No active disease. Electronically Signed   By: Anner Crete M.D.   On: 12/23/2021 22:21    Disposition   Pt is being discharged home today in good condition.  Follow-up Plans & Appointments     Follow-up Information     Deberah Pelton, NP Follow up on 01/02/2022.   Specialty: Cardiology Why: at 2:20pm for your follow up appt with Dr. Rachel Bo' NP [place] information: [address] [place] [address] [phone]                Discharge Instructions     Amb Referral to Cardiac Rehabilitation   Complete by: As directed    Diagnosis: NSTEMI   After initial evaluation and assessments completed: Virtual Based Care may be provided alone or in conjunction with Phase 2 Cardiac Rehab based on patient barriers.: Yes   Call MD for:  difficulty breathing, headache or visual disturbances   Complete by: As directed    Call MD for:  persistant dizziness or light-headedness   Complete by: As directed    Call MD for:  redness, tenderness, or signs of infection (pain, swelling, redness, odor or green/yellow discharge around incision site)   Complete by: As directed    Diet - low sodium heart healthy   Complete by: As directed    Discharge instructions   Complete by: As directed    Radial Site Care Refer to this sheet in the next few weeks. These instructions provide you with information on caring for yourself after your procedure. Your caregiver may also give you more specific instructions. Your treatment has been planned according to  current medical practices, but problems sometimes  occur. Call your caregiver if you have any problems or questions after your procedure. HOME CARE INSTRUCTIONS You may shower the day after the procedure. Remove the bandage (dressing) and gently wash the site with plain soap and water. Gently pat the site dry.  Do not apply powder or lotion to the site.  Do not submerge the affected site in water for 3 to 5 days.  Inspect the site at least twice daily.  Do not flex or bend the affected arm for 24 hours.  No lifting over 5 pounds (2.3 kg) for 5 days after your procedure.  Do not drive home if you are discharged the same day of the procedure. Have someone else drive you.  You may drive 24 hours after the procedure unless otherwise instructed by your caregiver.  What to expect: Any bruising will usually fade within 1 to 2 weeks.  Blood that collects in the tissue (hematoma) may be painful to the touch. It should usually decrease in size and tenderness within 1 to 2 weeks.  SEEK IMMEDIATE MEDICAL CARE IF: You have unusual pain at the radial site.  You have redness, warmth, swelling, or pain at the radial site.  You have drainage (other than a small amount of blood on the dressing).  You have chills.  You have a fever or persistent symptoms for more than 72 hours.  You have a fever and your symptoms suddenly get worse.  Your arm becomes pale, cool, tingly, or numb.  You have heavy bleeding from the site. Hold pressure on the site.   Increase activity slowly   Complete by: As directed        Discharge Medications   Allergies as of 12/26/2021       Reactions   Atorvastatin Other (See Comments)   Flu symptoms   Fluvastatin Other (See Comments)   Flu symptoms   Simvastatin Other (See Comments)   Flu symptoms        Medication List     STOP taking these medications    amoxicillin 875 MG tablet Commonly known as: AMOXIL   Zetia 10 MG tablet Generic drug: ezetimibe        TAKE these medications    acetaminophen 500 MG tablet Commonly known as: TYLENOL Take 1,000 mg by mouth See admin instructions. 1000 mg by mouth 1-2 times a day as needed for pain   Aplenzin 522 MG Tb24 Generic drug: BuPROPion HBr Take 522 mg by mouth daily.   aspirin EC 81 MG tablet Take 1 tablet (81 mg total) by mouth daily. Swallow whole. Start taking on: December 27, 2021   azelastine 0.1 % nasal spray Commonly known as: ASTELIN Place 1 spray into both nostrils 2 (two) times daily.   BASAGLAR KWIKPEN [place] Inject 10-20 Units into the skin in the morning.   bismuth subsalicylate 578 IO/96EX suspension Commonly known as: PEPTO BISMOL Take by mouth See admin instructions. 1 swallow 2-3 times a day as needed for diarrhea   Calcium 250 MG Caps Take 250 mg by mouth in the morning and at bedtime.   fluconazole 150 MG tablet Commonly known as: Diflucan Take 1 tablet (150 mg total) by mouth once. What changed:  when to take this reasons to take this   Iron Tabs Take 1 tablet by mouth daily.   JOINT SUPPORT PO Take 1-2 tablets by mouth daily.   lamoTRIgine 100 MG tablet Commonly known as: LAMICTAL Take 100-200 mg by mouth See admin instructions. Take 100  mg by mouth in the morning and 200 mg by mouth at bedtime   levocetirizine 5 MG tablet Commonly known as: XYZAL Take 5 mg by mouth every evening.   metFORMIN 500 MG 24 hr tablet Commonly known as: GLUCOPHAGE-XR Take 1,000 mg by mouth 2 (two) times daily.   montelukast 10 MG tablet Commonly known as: SINGULAIR Take 10 mg by mouth at bedtime.   multivitamin tablet Take 1 tablet by mouth daily.   omeprazole 40 MG capsule Commonly known as: PRILOSEC Take 40 mg by mouth daily.   ondansetron 8 MG tablet Commonly known as: ZOFRAN Take 8 mg by mouth as needed for nausea or vomiting.   OVER THE COUNTER MEDICATION Apply 1 Application topically as needed (pain). OTC Real time cream   OVER THE COUNTER  MEDICATION Take 1 tablet by mouth daily. Mitomax   rosuvastatin 20 MG tablet Commonly known as: CRESTOR Take 1 tablet (20 mg total) by mouth daily. Start taking on: December 27, 2021   tirzepatide 2.5 MG/0.5ML Pen Commonly known as: MOUNJARO Inject 2.5 mg into the skin once a week.   Trintellix 10 MG Tabs tablet Generic drug: vortioxetine HBr Take 10 mg by mouth in the morning and at bedtime.   VITAMIN D PO Take 5,000 Units by mouth daily. Patient uses 5000 iu.         Outstanding Labs/Studies   FLP/LFTs in 8 weeks   Duration of Discharge Encounter   Greater than 30 minutes including physician time.  Signed, Reino Bellis, NP 12/26/2021, 9:20 AM  Agree with note by Reino Bellis NP-C  Patient admitted with "non-STEMI".  Cardiac cath revealed nonobstructive disease.  Echo showed akinesis of a portion of her septum.  This was thought to be a variant of "Takotsubo cardiomyopathy".  She has had no recurrent symptoms.  She has a very stressful job which we discussed.  Her blood pressure is too low to start a beta-blocker.  She will be discharged home on aspirin alone.  She has had gastric bypass in the past.  We will arrange TOC 7 then follow-up with Dr. Burt Knack.  Lorretta Harp, M.D., [place], [facility], [place] [address] [place], [place]  [id number]  [phone] 12/26/2021 9:46 AM

## 2021-12-26 NOTE — Progress Notes (Signed)
Pt safely discharged. Discharge packet provided with teach-back method. VS wnL and as per flow. IVs removed, Pt verbalized understanding. All questions and concerns addressed. Awaiting on cardiac PA for final education & TOC meds. Pt discharge lounge ready.

## 2021-12-28 NOTE — Progress Notes (Unsigned)
Cardiology Clinic Note   Patient Name: Jessica Bridges Date of Encounter: 12/28/2021  Primary Care Provider:  Fanny Bien, MD Primary Cardiologist:  Quay Burow, MD  Patient Profile    ***  Past Medical History    Past Medical History:  Diagnosis Date   Diabetes mellitus without complication ([place])    Sleep apnea    CPAP   Past Surgical History:  Procedure Laterality Date   BREAST BIOPSY     right   CHOLECYSTECTOMY     CORONARY/GRAFT ACUTE MI REVASCULARIZATION N/A 12/24/2021   Procedure: Coronary/Graft Acute MI Revascularization;  Surgeon: [name], MD;  Location: [facility];  Service: Cardiovascular;  Laterality: N/A;   DENTAL RESTORATION/EXTRACTION WITH X-RAY  12/2016   GASTRIC BYPASS     LEFT HEART CATH AND CORONARY ANGIOGRAPHY N/A 12/24/2021   Procedure: LEFT HEART CATH AND CORONARY ANGIOGRAPHY;  Surgeon: [name], MD;  Location: [facility];  Service: Cardiovascular;  Laterality: N/A;   LIVER BIOPSY     WISDOM TOOTH EXTRACTION      Allergies  Allergies  Allergen Reactions   Atorvastatin Other (See Comments)    Flu symptoms   Fluvastatin Other (See Comments)    Flu symptoms   Simvastatin Other (See Comments)    Flu symptoms    History of Present Illness    ***  Home Medications    Prior to Admission medications   Medication Sig Start Date End Date Taking? Authorizing Provider  acetaminophen (TYLENOL) 500 MG tablet Take 1,000 mg by mouth See admin instructions. 1000 mg by mouth 1-2 times a day as needed for pain    [provider]  APLENZIN 522 MG TB24 Take 522 mg by mouth daily. 12/20/21   [provider]  aspirin EC 81 MG tablet Take 1 tablet (81 mg total) by mouth daily. Swallow whole. 12/27/21   Cheryln Manly, NP  azelastine (ASTELIN) 0.1 % nasal spray Place 1 spray into both nostrils 2 (two) times daily.    [provider]  bismuth subsalicylate (PEPTO BISMOL) 262 MG/15ML  suspension Take by mouth See admin instructions. 1 swallow 2-3 times a day as needed for diarrhea    [provider]  Calcium 250 MG CAPS Take 250 mg by mouth in the morning and at bedtime.    [provider]  Cholecalciferol (VITAMIN D PO) Take 5,000 Units by mouth daily. Patient uses 5000 iu.     [provider]  fluconazole (DIFLUCAN) 150 MG tablet Take 1 tablet (150 mg total) by mouth as needed (occasionally). 12/26/21   Lorretta Harp, MD  Insulin Glargine (BASAGLAR KWIKPEN ) Inject 10-20 Units into the skin in the morning.    [provider]  Iron TABS Take 1 tablet by mouth daily.    [provider]  lamoTRIgine (LAMICTAL) 100 MG tablet Take 100-200 mg by mouth See admin instructions. Take 100 mg by mouth in the morning and 200 mg by mouth at bedtime    [provider]  levocetirizine (XYZAL) 5 MG tablet Take 5 mg by mouth every evening. Patient not taking: Reported on 12/24/2021    [provider]  metFORMIN (GLUCOPHAGE-XR) 500 MG 24 hr tablet Take 1,000 mg by mouth 2 (two) times daily. 12/21/21   [provider]  Misc Natural Products (JOINT SUPPORT PO) Take 1-2 tablets by mouth daily.    [provider]  montelukast (SINGULAIR) 10 MG tablet Take 10 mg  by mouth at bedtime.    [provider]  Multiple Vitamin (MULTIVITAMIN) tablet Take 1 tablet by mouth daily.    [provider]  omeprazole (PRILOSEC) 40 MG capsule Take 40 mg by mouth daily.     [provider]  ondansetron (ZOFRAN) 8 MG tablet Take 8 mg by mouth as needed for nausea or vomiting.    [provider]  OVER THE COUNTER MEDICATION Apply 1 Application topically as needed (pain). OTC Real time cream    [provider]  OVER THE COUNTER MEDICATION Take 1 tablet by mouth daily. Mitomax    [provider]  rosuvastatin (CRESTOR) 20 MG tablet Take 1 tablet (20 mg total) by mouth daily. 12/27/21    Cheryln Manly, NP  tirzepatide [facility]) 2.5 MG/0.5ML Pen Inject 2.5 mg into the skin once a week.    [provider]  vortioxetine HBr (TRINTELLIX) 10 MG TABS Take 10 mg by mouth in the morning and at bedtime.    [provider]    Family History    Family History  Problem Relation Age of Onset   Colon cancer Neg Hx    She indicated that the status of her neg hx is unknown.  Social History    Social History   Socioeconomic History   Marital status: Married    Spouse name: Not on file   Number of children: Not on file   Years of education: Not on file   Highest education level: Not on file  Occupational History   Not on file  Tobacco Use   Smoking status: Never   Smokeless tobacco: Never  Substance and Sexual Activity   Alcohol use: No   Drug use: No   Sexual activity: Not on file  Other Topics Concern   Not on file  Social History Narrative   Not on file   Social Determinants of Health   Financial Resource Strain: Not on file  Food Insecurity: Not on file  Transportation Needs: Not on file  Physical Activity: Not on file  Stress: Not on file  Social Connections: Not on file  Intimate Partner Violence: Not on file     Review of Systems    General:  No chills, fever, night sweats or weight changes.  Cardiovascular:  No chest pain, dyspnea on exertion, edema, orthopnea, palpitations, paroxysmal nocturnal dyspnea. Dermatological: No rash, lesions/masses Respiratory: No cough, dyspnea Urologic: No hematuria, dysuria Abdominal:   No nausea, vomiting, diarrhea, bright red blood per rectum, melena, or hematemesis Neurologic:  No visual changes, wkns, changes in mental status. All other systems reviewed and are otherwise negative except as noted above.  Physical Exam    VS:  There were no vitals taken for this visit. , BMI There is no height or weight on file to calculate BMI. GEN: Well nourished, well developed, in no acute  distress. HEENT: normal. Neck: Supple, no JVD, carotid bruits, or masses. Cardiac: RRR, no murmurs, rubs, or gallops. No clubbing, cyanosis, edema.  Radials/DP/PT 2+ and equal bilaterally.  Respiratory:  Respirations regular and unlabored, clear to auscultation bilaterally. GI: Soft, nontender, nondistended, BS + x 4. MS: no deformity or atrophy. Skin: warm and dry, no rash. Neuro:  Strength and sensation are intact. Psych: Normal affect.  Accessory Clinical Findings    Recent Labs: 12/24/2021: BUN 7; Creatinine, Ser 0.88; Potassium 3.5; Sodium 133 12/26/2021: Hemoglobin 9.5; Platelets 238   Recent Lipid Panel    Component Value Date/Time  CHOL 133 12/24/2021 0537   TRIG 120 12/24/2021 0537   HDL 66 12/24/2021 0537   CHOLHDL 2.0 12/24/2021 0537   VLDL 24 12/24/2021 0537   LDLCALC 43 12/24/2021 0537    ECG personally reviewed by me today- *** - No acute changes  Assessment & Plan   1.  ***   Jossie Ng. Heidi Lemay NP-C    12/28/2021, 8:09 AM [address] Office [phone] Fax [phone]  Notice: This dictation was prepared with Dragon dictation along with smaller phrase technology. Any transcriptional errors that result from this process are unintentional and may not be corrected upon review.  I spent***minutes examining this patient, reviewing medications, and using patient centered shared decision making involving her cardiac care.  Prior to her visit I spent greater than 20 minutes reviewing her past medical history,  medications, and prior cardiac tests.

## 2022-01-01 NOTE — Progress Notes (Unsigned)
Office Visit    Jessica Bridges Name: Jessica Bridges Date of Encounter: 01/02/2022  Primary Care Provider:  Fanny Bien, MD Primary Cardiologist:  Quay Burow, MD Primary Electrophysiologist: None  Chief Complaint    Jessica Bridges is a 56 y.o. female with PMH of NSTEMI, gastric bypass GERD, DM who presents today for post hospital follow-up of NSTEMI.  Past Medical History    Past Medical History:  Diagnosis Date   Diabetes mellitus without complication ([place])    Sleep apnea    CPAP   Past Surgical History:  Procedure Laterality Date   BREAST BIOPSY     right   CHOLECYSTECTOMY     CORONARY/GRAFT ACUTE MI REVASCULARIZATION N/A 12/24/2021   Procedure: Coronary/Graft Acute MI Revascularization;  Surgeon: [name], MD;  Location: [facility];  Service: Cardiovascular;  Laterality: N/A;   DENTAL RESTORATION/EXTRACTION WITH X-RAY  12/2016   GASTRIC BYPASS     LEFT HEART CATH AND CORONARY ANGIOGRAPHY N/A 12/24/2021   Procedure: LEFT HEART CATH AND CORONARY ANGIOGRAPHY;  Surgeon: [name], MD;  Location: [facility];  Service: Cardiovascular;  Laterality: N/A;   LIVER BIOPSY     WISDOM TOOTH EXTRACTION      Allergies  Allergies  Allergen Reactions   Atorvastatin Other (See Comments)    Flu symptoms   Fluvastatin Other (See Comments)    Flu symptoms   Simvastatin Other (See Comments)    Flu symptoms    History of Present Illness    Jessica Bridges is a 56 year old female with the above-mentioned past medical history who presents today for posthospital follow-up of NSTEMI.  Jessica Bridges presented to the ED on 12/23/2021 following complaint of nausea and 5/10 chest pain.  ACS work-up revealed troponin trending 500-14 100.  She was started on IV heparin and nitroglycerin with plan to admit to telemetry unit.  During Jessica Bridges work-up chest pain increased with central nature and urgent cath performed on 6/11.  LHC revealed mild nonobstructive  CAD that was felt induced by acute Takotsubo syndrome.  2D echo was performed with EF 45-50%, grade 1 DD, mildly decreased LV function of mid septum.  Medical therapy was recommended and Jessica Bridges was started on ASA 81 mg, Crestor 20 mg, and no BB due to soft blood pressure.  Since last being seen in the office Jessica Bridges reports that she is doing well no complaints of chest pain today.  She is accompanied today by her husband in office. She has been tolerating her Crestor without complaint of myalgias. Blood pressure today is 116/58 heart rate of 80 and Jessica Bridges endorses some fatigue but also states that her sleep has been broken since her heart attack.  Jessica Bridges denies chest pain, palpitations, dyspnea, PND, orthopnea, nausea, vomiting, dizziness, syncope, edema, weight gain, or early satiety.  Home Medications    Current Outpatient Medications  Medication Sig Dispense Refill   acetaminophen (TYLENOL) 500 MG tablet Take 1,000 mg by mouth See admin instructions. 1000 mg by mouth 1-2 times a day as needed for pain     APLENZIN 522 MG TB24 Take 522 mg by mouth daily.     aspirin EC 81 MG tablet Take 1 tablet (81 mg total) by mouth daily. Swallow whole. 90 tablet 3   azelastine (ASTELIN) 0.1 % nasal spray Place 1 spray into both nostrils 2 (two) times daily.     bismuth subsalicylate (PEPTO BISMOL) 262 MG/15ML suspension Take by mouth See admin instructions. 1  swallow 2-3 times a day as needed for diarrhea     Calcium 250 MG CAPS Take 250 mg by mouth in the morning and at bedtime.     Cholecalciferol (VITAMIN D PO) Take 5,000 Units by mouth daily. Jessica Bridges uses 5000 iu.      Insulin Glargine (BASAGLAR KWIKPEN [place]) Inject 10-20 Units into the skin in the morning.     Iron TABS Take 1 tablet by mouth daily.     lamoTRIgine (LAMICTAL) 100 MG tablet Take 100-200 mg by mouth See admin instructions. Take 100 mg by mouth in the morning and 200 mg by mouth at bedtime     levocetirizine (XYZAL) 5 MG tablet Take 5 mg  by mouth every evening.     metFORMIN (GLUCOPHAGE-XR) 500 MG 24 hr tablet Take 1,000 mg by mouth 2 (two) times daily.     metoprolol succinate (TOPROL XL) 25 MG 24 hr tablet Take 0.5 tablets (12.5 mg total) by mouth daily. 15 tablet 6   Misc Natural Products (JOINT SUPPORT PO) Take 1-2 tablets by mouth daily.     montelukast (SINGULAIR) 10 MG tablet Take 10 mg by mouth at bedtime.     Multiple Vitamin (MULTIVITAMIN) tablet Take 1 tablet by mouth daily.     omeprazole (PRILOSEC) 40 MG capsule Take 40 mg by mouth daily.      ondansetron (ZOFRAN) 8 MG tablet Take 8 mg by mouth as needed for nausea or vomiting.     OVER THE COUNTER MEDICATION Apply 1 Application topically as needed (pain). OTC Real time cream     OVER THE COUNTER MEDICATION Take 1 tablet by mouth daily. Mitomax     rosuvastatin (CRESTOR) 20 MG tablet Take 1 tablet (20 mg total) by mouth daily. 30 tablet 11   tirzepatide (MOUNJARO) 2.5 MG/0.5ML Pen Inject 2.5 mg into the skin once a week.     vortioxetine HBr (TRINTELLIX) 10 MG TABS Take 10 mg by mouth in the morning and at bedtime.     No current facility-administered medications for this visit.     Review of Systems  Please see the history of present illness.    (+) Fatigue (+) Nausea at times  All other systems reviewed and are otherwise negative except as noted above.  Physical Exam    Wt Readings from Last 3 Encounters:  01/02/22 177 lb 12.8 oz (80.6 kg)  12/24/21 173 lb 11.2 oz (78.8 kg)  05/16/17 185 lb (83.9 kg)   VS: Vitals:   01/02/22 1407  BP: (!) 116/58  Pulse: 80  SpO2: 98%  ,Body mass index is 28.7 kg/m.  Constitutional:      Appearance: Healthy appearance. Not in distress.  Neck:     Vascular: JVD normal.  Pulmonary:     Effort: Pulmonary effort is normal.     Breath sounds: No wheezing. No rales. Diminished in the bases Cardiovascular:     Normal rate. Regular rhythm. Normal S1. Normal S2.      Murmurs: There is no murmur.  Edema:     Peripheral edema absent.  Abdominal:     Palpations: Abdomen is soft non tender. There is no hepatomegaly.  Skin:    General: Skin is warm and dry.  Neurological:     General: No focal deficit present.     Mental Status: Alert and oriented to person, place and time.     Cranial Nerves: Cranial nerves are intact.  EKG/LABS/Other Studies Reviewed    ECG personally  reviewed by me today -none today  Lab Results  Component Value Date   WBC 7.4 12/26/2021   HGB 9.5 (L) 12/26/2021   HCT 29.0 (L) 12/26/2021   MCV 82.4 12/26/2021   PLT 238 12/26/2021   Lab Results  Component Value Date   CREATININE 0.88 12/24/2021   BUN 7 12/24/2021   NA 133 (L) 12/24/2021   K 3.5 12/24/2021   CL 101 12/24/2021   CO2 22 12/24/2021   Lab Results  Component Value Date   ALT 31 04/25/2009   AST 33 04/25/2009   ALKPHOS 89 04/25/2009   BILITOT 0.9 04/25/2009   Lab Results  Component Value Date   CHOL 133 12/24/2021   HDL 66 12/24/2021   LDLCALC 43 12/24/2021   TRIG 120 12/24/2021   CHOLHDL 2.0 12/24/2021    Lab Results  Component Value Date   HGBA1C 7.0 (H) 12/24/2021    Assessment & Plan    1.  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 -She denies any anginal symptoms today or complaints today -Start metoprolol succinate 12.5 mg daily with plan to increase to 25 mg if blood pressure tolerates -Continue GDMT Crestor 20 mg daily and ASA 81 mg (enteric-coated due to gastric bypass) daily -Start cardiac rehab  2.  Diabetes type 2: -Last hemoglobin A1c 7.0 -Continue current glycemic regimen per PCP  3.  GERD: -Continue omeprazole 40 mg daily -History of gastric bypass  4.  Stress-induced cardiomyopathy: -Pathophysiology discussed at length today with Jessica Bridges and spouse regarding cause and decrease in heart function -2D echo completed with EF of 45-50% with no RWMA -Continue GDMT as noted above   {The Jessica Bridges has an active order for  outpatient cardiac rehabilitation.   Please indicate if the Jessica Bridges is ready to start. Do NOT delete this.  It will auto delete.  Refresh note, then sign.              Click here to document readiness and see contraindications.  :1}  Cardiac Rehabilitation Eligibility Assessment  The Jessica Bridges is ready to start cardiac rehabilitation from a cardiac standpoint.     Disposition: Follow-up with Quay Burow, MD or APP in 1 months     Medication Adjustments/Labs and Tests Ordered: Current medicines are reviewed at length with the Jessica Bridges today.  Concerns regarding medicines are outlined above.   Signed, Mable Fill, Marissa Nestle, NP 01/02/2022, 3:05 PM [place]

## 2022-01-02 ENCOUNTER — Ambulatory Visit (INDEPENDENT_AMBULATORY_CARE_PROVIDER_SITE_OTHER): Admitting: Nurse Practitioner

## 2022-01-02 ENCOUNTER — Encounter: Payer: Self-pay | Admitting: General Practice

## 2022-01-02 VITALS — BP 116/58 | HR 80 | Ht 66.0 in | Wt 177.8 lb

## 2022-01-02 DIAGNOSIS — Z794 Long term (current) use of insulin: Secondary | ICD-10-CM

## 2022-01-02 DIAGNOSIS — E119 Type 2 diabetes mellitus without complications: Secondary | ICD-10-CM | POA: Diagnosis not present

## 2022-01-02 DIAGNOSIS — I214 Non-ST elevation (NSTEMI) myocardial infarction: Secondary | ICD-10-CM | POA: Diagnosis not present

## 2022-01-02 DIAGNOSIS — I5181 Takotsubo syndrome: Secondary | ICD-10-CM

## 2022-01-02 DIAGNOSIS — K219 Gastro-esophageal reflux disease without esophagitis: Secondary | ICD-10-CM

## 2022-01-02 MED ORDER — METOPROLOL SUCCINATE ER 25 MG PO TB24: 12.5000 mg | ORAL_TABLET | Freq: Every day | ORAL | 6 refills | Status: DC

## 2022-01-02 NOTE — Patient Instructions (Signed)
Medication Instructions:  The current medical regimen is effective;  continue present plan and medications as directed. Please refer to the Current Medication list given to you today.  *If you need a refill on your cardiac medications before your next appointment, please call your pharmacy*  Lab Work:    Testing/Procedures:  LIPID AND LFT IN 6 WEEKS  NONE If you have labs (blood work) drawn today and your tests are completely normal, you will receive your results only by: [place] (if you have MyChart) OR  A paper copy in the mail If you have any lab test that is abnormal or we need to change your treatment, we will call you to review the results.  Follow-Up: Your next appointment:  1 month(s)  with ANY MID-LEVEL PROVIDER    At [facility], you and your health needs are our priority.  As part of our continuing mission to provide you with exceptional heart care, we have created designated Provider Care Teams.  These Care Teams include your primary Cardiologist (physician) and Advanced Practice Providers (APPs -  Physician Assistants and Nurse Practitioners) who all work together to provide you with the care you need, when you need it.  We recommend signing up for the patient portal called "MyChart".  Sign up information is provided on this After Visit Summary.  MyChart is used to connect with patients for Virtual Visits (Telemedicine).  Patients are able to view lab/test results, encounter notes, upcoming appointments, etc.  Non-urgent messages can be sent to your provider as well.   To learn more about what you can do with MyChart, go to [email or web address].     Important Information About Sugar

## 2022-01-05 ENCOUNTER — Encounter (HOSPITAL_COMMUNITY): Payer: Self-pay | Admitting: Emergency Medicine

## 2022-01-05 ENCOUNTER — Emergency Department (HOSPITAL_COMMUNITY)
Admission: EM | Admit: 2022-01-05 | Discharge: 2022-01-05 | Disposition: A | Discharge status: Home / Self Care | Attending: Emergency Medicine | Admitting: Emergency Medicine

## 2022-01-05 ENCOUNTER — Other Ambulatory Visit: Payer: Self-pay

## 2022-01-05 ENCOUNTER — Emergency Department (HOSPITAL_COMMUNITY)

## 2022-01-05 DIAGNOSIS — R600 Localized edema: Secondary | ICD-10-CM | POA: Diagnosis not present

## 2022-01-05 DIAGNOSIS — Z7982 Long term (current) use of aspirin: Secondary | ICD-10-CM | POA: Diagnosis not present

## 2022-01-05 DIAGNOSIS — Z7984 Long term (current) use of oral hypoglycemic drugs: Secondary | ICD-10-CM | POA: Diagnosis not present

## 2022-01-05 DIAGNOSIS — R079 Chest pain, unspecified: Secondary | ICD-10-CM | POA: Insufficient documentation

## 2022-01-05 DIAGNOSIS — Z794 Long term (current) use of insulin: Secondary | ICD-10-CM | POA: Diagnosis not present

## 2022-01-05 DIAGNOSIS — E119 Type 2 diabetes mellitus without complications: Secondary | ICD-10-CM | POA: Insufficient documentation

## 2022-01-05 LAB — COMPREHENSIVE METABOLIC PANEL
ALT: 26 U/L (ref 0–44)
AST: 25 U/L (ref 15–41)
Albumin: 3.7 g/dL (ref 3.5–5.0)
Alkaline Phosphatase: 54 U/L (ref 38–126)
Anion gap: 14 (ref 5–15)
BUN: 8 mg/dL (ref 6–20)
CO2: 19 mmol/L — ABNORMAL LOW (ref 22–32)
Calcium: 8.9 mg/dL (ref 8.9–10.3)
Chloride: 98 mmol/L (ref 98–111)
Creatinine, Ser: 0.88 mg/dL (ref 0.44–1.00)
GFR, Estimated: >60 mL/min (ref >60)
Glucose, Bld: 144 mg/dL — ABNORMAL HIGH (ref 70–99)
Potassium: 3.9 mmol/L (ref 3.5–5.1)
Sodium: 131 mmol/L — ABNORMAL LOW (ref 135–145)
Total Bilirubin: 0.5 mg/dL (ref 0.3–1.2)
Total Protein: 6.2 g/dL — ABNORMAL LOW (ref 6.5–8.1)

## 2022-01-05 LAB — CBC WITH DIFFERENTIAL/PLATELET
Abs Immature Granulocytes: 0.04 10*3/uL (ref 0.00–0.07)
Basophils Absolute: 0 10*3/uL (ref 0.0–0.1)
Basophils Relative: 1 %
Eosinophils Absolute: 0.1 10*3/uL (ref 0.0–0.5)
Eosinophils Relative: 2 %
HCT: 27.2 % — ABNORMAL LOW (ref 36.0–46.0)
Hemoglobin: 9 g/dL — ABNORMAL LOW (ref 12.0–15.0)
Immature Granulocytes: 1 %
Lymphocytes Relative: 33 %
Lymphs Abs: 2.7 10*3/uL (ref 0.7–4.0)
MCH: 27 pg (ref 26.0–34.0)
MCHC: 33.1 g/dL (ref 30.0–36.0)
MCV: 81.7 fL (ref 80.0–100.0)
Monocytes Absolute: 0.4 10*3/uL (ref 0.1–1.0)
Monocytes Relative: 5 %
Neutro Abs: 4.9 10*3/uL (ref 1.7–7.7)
Neutrophils Relative %: 58 %
Platelets: 294 10*3/uL (ref 150–400)
RBC: 3.33 MIL/uL — ABNORMAL LOW (ref 3.87–5.11)
RDW: 14.1 % (ref 11.5–15.5)
WBC: 8.3 10*3/uL (ref 4.0–10.5)
nRBC: 0 % (ref 0.0–0.2)

## 2022-01-05 LAB — TROPONIN I (HIGH SENSITIVITY)
Troponin I (High Sensitivity): 4 ng/L (ref <18)
Troponin I (High Sensitivity): 5 ng/L (ref <18)

## 2022-01-05 LAB — BRAIN NATRIURETIC PEPTIDE: B Natriuretic Peptide: 75.1 pg/mL (ref 0.0–100.0)

## 2022-01-05 LAB — D-DIMER, QUANTITATIVE: D-Dimer, Quant: 0.3 ug/mL-FEU (ref 0.00–0.50)

## 2022-01-05 LAB — LIPASE, BLOOD: Lipase: 29 U/L (ref 11–51)

## 2022-01-05 MED ORDER — LORAZEPAM 2 MG/ML IJ SOLN
1.0000 mg | Freq: Once | INTRAMUSCULAR | Status: AC
  Administered 2022-01-05: 1 mg via INTRAVENOUS
  Filled 2022-01-05: qty 1

## 2022-01-05 NOTE — ED Triage Notes (Signed)
Per [facility] pt coming from work for epigastric chest pain, dizziness, and nausea. Patient states she was here 2 weeks ago for same- had elevated troponin but no blockage. Given 0.4 nitro w/o relief. States she took 81 mg aspirin this morning- no extra aspirin given by ems due to ulcer history. 20G L FA

## 2022-02-01 ENCOUNTER — Ambulatory Visit: Admitting: Nurse Practitioner

## 2022-02-05 NOTE — Progress Notes (Unsigned)
Office Visit    Patient Name: Jessica Bridges Date of Encounter: 02/06/2022  PCP:  Bridget Hartshorn, NP   [place]  Cardiologist:  Quay Burow, MD  Advanced Practice Provider:  No care team member to display Electrophysiologist:  None    HPI    Jessica Bridges is a 56 y.o. female with a past medical history significant for DM, GERD, gastric bypass and depression presents today for hospital follow-up.  01/05/22 She was recently seen in the ER for chest pain.  EKG was performed with no ischemic changes.  Chest pain started about an hour and a half ago prior to ED visit.  She had a history significant for Takotsubo cardiomyopathy.  At that time she was started on a statin and a beta-blocker.  She did have some mild edema in her legs.  Troponin negative x2.  Chest x-ray without any evidence of pneumonia or pneumothorax.  D-dimer is normal as well as BNP.  It was suspected that her chest pain was due to stress/anxiety she was feeling better after Ativan.  12/23/21 she presented with NSTEMI.  She had a widely patent left main and patent LAD with mild nonobstructive mid vessel plaque, angiographically normal left circumflex and normal LVEDP (cardiac cath 12/24/2021).  Diagnosed with Takotsubo syndrome.  Last seen in the office 01/02/2022 and was doing well at that time.  She did not have any chest pain.  She been tolerating her Crestor without any issues.  Blood pressure was well controlled.  She was having some fatigue but also was not getting much sleep at that point.  Today, she states that she is having some dizziness and nausea that happens pretty much every day.  She does take some Zofran to try and help.  She was constipated but after discontinuing her metoprolol things have gone back to her normal.  She has experienced some "bruising all over" she is only taking aspirin 81 mg for blood thinner.  We do recommend that she continues this at this time.  She  recently started Crestor and she is unsure if she has had any side effects from it.  She discontinued the metoprolol on her own because she read about its interactions with depression and diabetes.  We did discuss that she will need to be on a beta-blocker but we can wait until her next appointment to start it if that is what she wants to do.  She will need some repeat labs in 2 weeks.  Reports no shortness of breath nor dyspnea on exertion. Reports no chest pain, pressure, or tightness. No edema, orthopnea, PND. Reports no palpitations.    Past Medical History    Past Medical History:  Diagnosis Date   Diabetes mellitus without complication ([place])    Sleep apnea    CPAP   Past Surgical History:  Procedure Laterality Date   BREAST BIOPSY     right   CHOLECYSTECTOMY     CORONARY/GRAFT ACUTE MI REVASCULARIZATION N/A 12/24/2021   Procedure: Coronary/Graft Acute MI Revascularization;  Surgeon: [name], MD;  Location: [facility];  Service: Cardiovascular;  Laterality: N/A;   DENTAL RESTORATION/EXTRACTION WITH X-RAY  12/2016   GASTRIC BYPASS     LEFT HEART CATH AND CORONARY ANGIOGRAPHY N/A 12/24/2021   Procedure: LEFT HEART CATH AND CORONARY ANGIOGRAPHY;  Surgeon: [name], MD;  Location: [facility];  Service: Cardiovascular;  Laterality: N/A;   LIVER BIOPSY  WISDOM TOOTH EXTRACTION      Allergies  Allergies  Allergen Reactions   Atorvastatin Other (See Comments)    Flu symptoms   Fluvastatin Other (See Comments)    Flu symptoms   Simvastatin Other (See Comments)    Flu symptoms     EKGs/Labs/Other Studies Reviewed:   The following studies were reviewed today: Cath: 12/24/21   1.  Widely patent left main 2.  Patent LAD with mild nonobstructive mid vessel plaquing 3.  Angiographically normal left circumflex 4.  Normal LVEDP  Recommendations: The patient has mild nonobstructive coronary artery disease.  I suspect she is having a non-coronary  event such as atypical stress-induced event like acute Takotsubo syndrome.  I reviewed her echo and her wall motion abnormality is focal involving the mid-septum.  It does not appear to follow any typical coronary distribution.  Recommend medical therapy.   Echo: 12/24/21   IMPRESSIONS     1. Akinesis of the midseptum with overall mild LV dysfunction.   2. Left ventricular ejection fraction, by estimation, is 45 to 50%. The  left ventricle has mildly decreased function. The left ventricle  demonstrates regional wall motion abnormalities (see scoring  diagram/findings for description). Left ventricular  diastolic parameters are consistent with Grade I diastolic dysfunction  (impaired relaxation).   3. Right ventricular systolic function is normal. The right ventricular  size is normal. Tricuspid regurgitation signal is inadequate for assessing  PA pressure.   4. The mitral valve is normal in structure. Trivial mitral valve  regurgitation. No evidence of mitral stenosis.   5. The aortic valve is tricuspid. Aortic valve regurgitation is not  visualized. No aortic stenosis is present.   6. The inferior vena cava is normal in size with greater than 50%  respiratory variability, suggesting right atrial pressure of 3 mmHg.   FINDINGS   Left Ventricle: Left ventricular ejection fraction, by estimation, is 45  to 50%. The left ventricle has mildly decreased function. The left  ventricle demonstrates regional wall motion abnormalities. The left  ventricular internal cavity size was normal  in size. There is no left ventricular hypertrophy. Left ventricular  diastolic parameters are consistent with Grade I diastolic dysfunction  (impaired relaxation).   Right Ventricle: The right ventricular size is normal. Right ventricular  systolic function is normal. Tricuspid regurgitation signal is inadequate  for assessing PA pressure. The tricuspid regurgitant velocity is 2.17 m/s,  and with an  assumed right atrial   pressure of 5 mmHg, the estimated right ventricular systolic pressure is  88.5 mmHg.   Left Atrium: Left atrial size was normal in size.   Right Atrium: Right atrial size was normal in size.   Pericardium: There is no evidence of pericardial effusion.   Mitral Valve: The mitral valve is normal in structure. Trivial mitral  valve regurgitation. No evidence of mitral valve stenosis.   Tricuspid Valve: The tricuspid valve is normal in structure. Tricuspid  valve regurgitation is trivial. No evidence of tricuspid stenosis.   Aortic Valve: The aortic valve is tricuspid. Aortic valve regurgitation is  not visualized. No aortic stenosis is present. Aortic valve peak gradient  measures 6.0 mmHg.   Pulmonic Valve: The pulmonic valve was normal in structure. Pulmonic valve  regurgitation is not visualized. No evidence of pulmonic stenosis.   Aorta: The aortic root is normal in size and structure.   Venous: The inferior vena cava is normal in size with greater than 50%  respiratory variability, suggesting right atrial  pressure of 3 mmHg.   IAS/Shunts: No atrial level shunt detected by color flow Doppler.   Additional Comments: Akinesis of the midseptum with overall mild LV  dysfunction.    EKG:  EKG is not ordered today.   Recent Labs: 01/05/2022: ALT 26; B Natriuretic Peptide 75.1; BUN 8; Creatinine, Ser 0.88; Hemoglobin 9.0; Platelets 294; Potassium 3.9; Sodium 131  Recent Lipid Panel    Component Value Date/Time   CHOL 133 12/24/2021 0537   TRIG 120 12/24/2021 0537   HDL 66 12/24/2021 0537   CHOLHDL 2.0 12/24/2021 0537   VLDL 24 12/24/2021 0537   LDLCALC 43 12/24/2021 0537    Home Medications   Current Meds  Medication Sig   acetaminophen (TYLENOL) 500 MG tablet Take 1,000 mg by mouth every 6 (six) hours as needed for moderate pain.   APLENZIN 522 MG TB24 Take 522 mg by mouth daily.   aspirin EC 81 MG tablet Take 1 tablet (81 mg total) by mouth  daily. Swallow whole.   azelastine (ASTELIN) 0.1 % nasal spray Place 1 spray into both nostrils 2 (two) times daily.   bismuth subsalicylate (PEPTO BISMOL) 262 MG/15ML suspension Take 30 mLs by mouth every 4 (four) hours as needed for diarrhea or loose stools or indigestion.   Calcium 250 MG CAPS Take 250 mg by mouth in the morning and at bedtime.   Cholecalciferol (VITAMIN D PO) Take 5,000 Units by mouth daily. Patient uses 5000 iu.    Insulin Glargine (BASAGLAR KWIKPEN [place]) Inject 10-20 Units into the skin in the morning. Per sliding scale   Iron TABS Take 1 tablet by mouth daily.   levocetirizine (XYZAL) 5 MG tablet Take 5 mg by mouth every evening.   metFORMIN (GLUCOPHAGE-XR) 500 MG 24 hr tablet Take 1,000 mg by mouth 2 (two) times daily.   Misc Natural Products (JOINT SUPPORT PO) Take 1-2 tablets by mouth daily.   montelukast (SINGULAIR) 10 MG tablet Take 10 mg by mouth at bedtime.   Multiple Vitamin (MULTIVITAMIN) tablet Take 1 tablet by mouth daily.   omeprazole (PRILOSEC) 40 MG capsule Take 40 mg by mouth daily.    ondansetron (ZOFRAN) 8 MG tablet Take 8 mg by mouth as needed for nausea or vomiting.   OVER THE COUNTER MEDICATION Apply 1 Application topically daily as needed (pain). Medication OTC: Real time cream   OVER THE COUNTER MEDICATION Take 1 tablet by mouth daily. Medication: Mitomax   rosuvastatin (CRESTOR) 20 MG tablet Take 1 tablet (20 mg total) by mouth daily.   tirzepatide [facility] [address]) 2.5 MG/0.5ML Pen Inject 2.5 mg into the skin once a week.   vortioxetine HBr (TRINTELLIX) 10 MG TABS Take 10 mg by mouth in the morning and at bedtime.   [DISCONTINUED] lamoTRIgine (LAMICTAL) 100 MG tablet Take 100-200 mg by mouth See admin instructions. Take 100 mg by mouth in the morning and 200 mg by mouth at bedtime   [DISCONTINUED] metoprolol succinate (TOPROL XL) 25 MG 24 hr tablet Take 0.5 tablets (12.5 mg total) by mouth daily.     Review of Systems      All other systems reviewed and  are otherwise negative except as noted above.  Physical Exam    VS:  BP 116/68   Pulse 94   Ht '5\' 6"'$  (1.676 m)   Wt 170 lb (77.1 kg)   SpO2 96%   BMI 27.44 kg/m  , BMI Body mass index is 27.44 kg/m.  Wt Readings from Last 3 Encounters:  02/06/22 170 lb (77.1 kg)  01/05/22 177 lb (80.3 kg)  01/02/22 177 lb 12.8 oz (80.6 kg)     GEN: Well nourished, well developed, in no acute distress. HEENT: normal. Neck: Supple, no JVD, carotid bruits, or masses. Cardiac: RRR, no murmurs, rubs, or gallops. No clubbing, cyanosis, edema.  Radials/PT 2+ and equal bilaterally.  Respiratory:  Respirations regular and unlabored, clear to auscultation bilaterally. GI: Soft, nontender, nondistended. MS: No deformity or atrophy. Skin: Warm and dry, no rash. Neuro:  Strength and sensation are intact. Psych: Normal affect.  Assessment & Plan    NSTEMI/Takotsubo syndrome -Continue GDMT: Aspirin 81 mg, Crestor 20 mg, she discontinued the metoprolol succinate 12 and half milligrams -Will revisit trying a BB next visit. Coreg would be best for her given her EF.  -consider follow-up echo on next visit -She is not having any chest pain or shortness of breath -Her NSTEMI was stress-induced.  We have gone through some coping mechanisms for her current stressors -Her primary wants to start her on BuSpar and she plans to start this medication in a couple of weeks once she been on Crestor a little longer   Diabetes mellitus type 2 -A1c 7.0 when last checked -She remains on Mounjaro and Metformin -She was on insulin for a short period of time when she could not get her medications and she did not like this  GERD -Well-controlled on omeprazole -She was having some constipation but this has resolved -Continue Zofran for nausea  4. Possible hypotension -She states she is having some dizziness at home -Blood pressure well controlled today 116/68 -I encouraged her to take her blood pressure daily for 2  weeks and send me those values -She has not currently on any medication -Hydration encouraged         Cardiac Rehabilitation Eligibility Assessment     Disposition: Follow up 2-3 months with Quay Burow, MD or APP.  Signed, Elgie Collard, PA-C 02/06/2022, 10:49 AM [place]

## 2022-02-06 ENCOUNTER — Encounter: Payer: Self-pay | Admitting: Physician Assistant

## 2022-02-06 ENCOUNTER — Ambulatory Visit (INDEPENDENT_AMBULATORY_CARE_PROVIDER_SITE_OTHER): Admitting: Physician Assistant

## 2022-02-06 VITALS — BP 116/68 | HR 94 | Ht 66.0 in | Wt 170.0 lb

## 2022-02-06 DIAGNOSIS — I5181 Takotsubo syndrome: Secondary | ICD-10-CM

## 2022-02-06 DIAGNOSIS — E119 Type 2 diabetes mellitus without complications: Secondary | ICD-10-CM | POA: Diagnosis not present

## 2022-02-06 DIAGNOSIS — I214 Non-ST elevation (NSTEMI) myocardial infarction: Secondary | ICD-10-CM | POA: Diagnosis not present

## 2022-02-06 DIAGNOSIS — K219 Gastro-esophageal reflux disease without esophagitis: Secondary | ICD-10-CM | POA: Diagnosis not present

## 2022-02-06 DIAGNOSIS — Z794 Long term (current) use of insulin: Secondary | ICD-10-CM

## 2022-02-06 NOTE — Patient Instructions (Signed)
Medication Instructions:   Your physician recommends that you continue on your current medications as directed. Please refer to the Current Medication list given to you today.   *If you need a refill on your cardiac medications before your next appointment, please call your pharmacy*   Lab Work:  Your physician recommends that you return for a FASTING lipid profile/BMET/LFT/CBC. Wednesday, August 9. You can come in on the day of your appointment anytime between 7:30-4:30 fasting from midnight the night before.   If you have labs (blood work) drawn today and your tests are completely normal, you will receive your results only by: [place] (if you have MyChart) OR A paper copy in the mail If you have any lab test that is abnormal or we need to change your treatment, we will call you to review the results.   Testing/Procedures:  None ordered.   Follow-Up: At [facility]: [facility], you and your health needs are our priority.  As part of our continuing mission to provide you with exceptional heart care, we have created designated Provider Care Teams.  These Care Teams include your primary Cardiologist (physician) and Advanced Practice Providers (APPs -  Physician Assistants and Nurse Practitioners) who all work together to provide you with the care you need, when you need it.  We recommend signing up for the patient portal called "MyChart".  Sign up information is provided on this After Visit Summary.  MyChart is used to connect with patients for Virtual Visits (Telemedicine).  Patients are able to view lab/test results, encounter notes, upcoming appointments, etc.  Non-urgent messages can be sent to your provider as well.   To learn more about what you can do with MyChart, go to [email or web address].    Your next appointment:   3 month(s)  The format for your next appointment:   In Person  Provider:   Quay Burow, MD     Other Instructions  Please monitor your Blood  pressure 1-2 weeks 1 hour after medication.  Please call @ [phone] or send mychart message for Johann Capers to review.   Important Information About Sugar

## 2022-02-16 ENCOUNTER — Telehealth (HOSPITAL_COMMUNITY): Payer: Self-pay

## 2022-02-16 NOTE — Telephone Encounter (Signed)
Pt is not interested in the cardiac rehab program, she states she is going back to work. Closed referral.

## 2022-02-21 ENCOUNTER — Other Ambulatory Visit

## 2022-02-21 DIAGNOSIS — K219 Gastro-esophageal reflux disease without esophagitis: Secondary | ICD-10-CM

## 2022-02-21 DIAGNOSIS — I5181 Takotsubo syndrome: Secondary | ICD-10-CM

## 2022-02-21 DIAGNOSIS — E119 Type 2 diabetes mellitus without complications: Secondary | ICD-10-CM

## 2022-02-21 DIAGNOSIS — Z794 Long term (current) use of insulin: Secondary | ICD-10-CM

## 2022-02-21 DIAGNOSIS — I214 Non-ST elevation (NSTEMI) myocardial infarction: Secondary | ICD-10-CM

## 2022-02-22 LAB — BASIC METABOLIC PANEL
BUN/Creatinine Ratio: 13 (ref 9–23)
BUN: 12 mg/dL (ref 6–24)
CO2: 20 mmol/L (ref 20–29)
Calcium: 10.2 mg/dL (ref 8.7–10.2)
Chloride: 93 mmol/L — ABNORMAL LOW (ref 96–106)
Creatinine, Ser: 0.92 mg/dL (ref 0.57–1.00)
Glucose: 91 mg/dL (ref 70–99)
Potassium: 4.7 mmol/L (ref 3.5–5.2)
Sodium: 131 mmol/L — ABNORMAL LOW (ref 134–144)
eGFR: 74 mL/min/{1.73_m2} (ref >59)

## 2022-02-22 LAB — CBC
Hematocrit: 34.4 % (ref 34.0–46.6)
Hemoglobin: 11.4 g/dL (ref 11.1–15.9)
MCH: 26.5 pg — ABNORMAL LOW (ref 26.6–33.0)
MCHC: 33.1 g/dL (ref 31.5–35.7)
MCV: 80 fL (ref 79–97)
Platelets: 334 10*3/uL (ref 150–450)
RBC: 4.3 x10E6/uL (ref 3.77–5.28)
RDW: 14.8 % (ref 11.7–15.4)
WBC: 9.3 10*3/uL (ref 3.4–10.8)

## 2022-02-22 LAB — HEPATIC FUNCTION PANEL
ALT: 26 IU/L (ref 0–32)
AST: 30 IU/L (ref 0–40)
Albumin: 4.9 g/dL (ref 3.8–4.9)
Alkaline Phosphatase: 78 IU/L (ref 44–121)
Bilirubin Total: 0.3 mg/dL (ref 0.0–1.2)
Bilirubin, Direct: 0.12 mg/dL (ref 0.00–0.40)
Total Protein: 7.3 g/dL (ref 6.0–8.5)

## 2022-02-22 LAB — LIPID PANEL
Chol/HDL Ratio: 1.5 ratio (ref 0.0–4.4)
Cholesterol, Total: 117 mg/dL (ref 100–199)
HDL: 78 mg/dL (ref >39)
LDL Chol Calc (NIH): 23 mg/dL (ref 0–99)
Triglycerides: 78 mg/dL (ref 0–149)
VLDL Cholesterol Cal: 16 mg/dL (ref 5–40)

## 2022-03-02 ENCOUNTER — Encounter (HOSPITAL_BASED_OUTPATIENT_CLINIC_OR_DEPARTMENT_OTHER): Payer: Self-pay | Admitting: Obstetrics and Gynecology

## 2022-03-02 ENCOUNTER — Emergency Department (HOSPITAL_BASED_OUTPATIENT_CLINIC_OR_DEPARTMENT_OTHER)
Admission: EM | Admit: 2022-03-02 | Discharge: 2022-03-02 | Disposition: A | Discharge status: Other Acute Inpatient Hospital | Attending: Emergency Medicine | Admitting: Emergency Medicine

## 2022-03-02 ENCOUNTER — Emergency Department (HOSPITAL_BASED_OUTPATIENT_CLINIC_OR_DEPARTMENT_OTHER)

## 2022-03-02 ENCOUNTER — Other Ambulatory Visit: Payer: Self-pay

## 2022-03-02 DIAGNOSIS — Z20822 Contact with and (suspected) exposure to covid-19: Secondary | ICD-10-CM | POA: Diagnosis not present

## 2022-03-02 DIAGNOSIS — F430 Acute stress reaction: Secondary | ICD-10-CM

## 2022-03-02 DIAGNOSIS — Y900 Blood alcohol level of less than 20 mg/100 ml: Secondary | ICD-10-CM | POA: Insufficient documentation

## 2022-03-02 DIAGNOSIS — Z7984 Long term (current) use of oral hypoglycemic drugs: Secondary | ICD-10-CM | POA: Insufficient documentation

## 2022-03-02 DIAGNOSIS — Z7982 Long term (current) use of aspirin: Secondary | ICD-10-CM | POA: Diagnosis not present

## 2022-03-02 DIAGNOSIS — E119 Type 2 diabetes mellitus without complications: Secondary | ICD-10-CM | POA: Diagnosis not present

## 2022-03-02 DIAGNOSIS — R4182 Altered mental status, unspecified: Secondary | ICD-10-CM | POA: Diagnosis present

## 2022-03-02 DIAGNOSIS — F32A Depression, unspecified: Secondary | ICD-10-CM

## 2022-03-02 DIAGNOSIS — F439 Reaction to severe stress, unspecified: Secondary | ICD-10-CM | POA: Diagnosis not present

## 2022-03-02 DIAGNOSIS — Z794 Long term (current) use of insulin: Secondary | ICD-10-CM | POA: Insufficient documentation

## 2022-03-02 HISTORY — DX: Bariatric surgery status: Z98.84

## 2022-03-02 LAB — CBC
HCT: 33 % — ABNORMAL LOW (ref 36.0–46.0)
Hemoglobin: 11 g/dL — ABNORMAL LOW (ref 12.0–15.0)
MCH: 26.7 pg (ref 26.0–34.0)
MCHC: 33.3 g/dL (ref 30.0–36.0)
MCV: 80.1 fL (ref 80.0–100.0)
Platelets: 300 10*3/uL (ref 150–400)
RBC: 4.12 MIL/uL (ref 3.87–5.11)
RDW: 14.9 % (ref 11.5–15.5)
WBC: 7.9 10*3/uL (ref 4.0–10.5)
nRBC: 0 % (ref 0.0–0.2)

## 2022-03-02 LAB — DIFFERENTIAL
Abs Immature Granulocytes: 0.02 10*3/uL (ref 0.00–0.07)
Basophils Absolute: 0 10*3/uL (ref 0.0–0.1)
Basophils Relative: 0 %
Eosinophils Absolute: 0.1 10*3/uL (ref 0.0–0.5)
Eosinophils Relative: 1 %
Immature Granulocytes: 0 %
Lymphocytes Relative: 38 %
Lymphs Abs: 3 10*3/uL (ref 0.7–4.0)
Monocytes Absolute: 0.4 10*3/uL (ref 0.1–1.0)
Monocytes Relative: 5 %
Neutro Abs: 4.4 10*3/uL (ref 1.7–7.7)
Neutrophils Relative %: 56 %

## 2022-03-02 LAB — COMPREHENSIVE METABOLIC PANEL
ALT: 26 U/L (ref 0–44)
AST: 26 U/L (ref 15–41)
Albumin: 4.6 g/dL (ref 3.5–5.0)
Alkaline Phosphatase: 63 U/L (ref 38–126)
Anion gap: 9 (ref 5–15)
BUN: 9 mg/dL (ref 6–20)
CO2: 24 mmol/L (ref 22–32)
Calcium: 9.7 mg/dL (ref 8.9–10.3)
Chloride: 99 mmol/L (ref 98–111)
Creatinine, Ser: 0.82 mg/dL (ref 0.44–1.00)
GFR, Estimated: >60 mL/min (ref >60)
Glucose, Bld: 152 mg/dL — ABNORMAL HIGH (ref 70–99)
Potassium: 4.7 mmol/L (ref 3.5–5.1)
Sodium: 132 mmol/L — ABNORMAL LOW (ref 135–145)
Total Bilirubin: 0.3 mg/dL (ref 0.3–1.2)
Total Protein: 7.6 g/dL (ref 6.5–8.1)

## 2022-03-02 LAB — RAPID URINE DRUG SCREEN, HOSP PERFORMED
Amphetamines: NOT DETECTED
Barbiturates: NOT DETECTED
Benzodiazepines: NOT DETECTED
Cocaine: NOT DETECTED
Opiates: NOT DETECTED
Tetrahydrocannabinol: NOT DETECTED

## 2022-03-02 LAB — APTT: aPTT: 32 seconds (ref 24–36)

## 2022-03-02 LAB — SALICYLATE LEVEL: Salicylate Lvl: <7 mg/dL — ABNORMAL LOW (ref 7.0–30.0)

## 2022-03-02 LAB — ACETAMINOPHEN LEVEL: Acetaminophen (Tylenol), Serum: <10 ug/mL — ABNORMAL LOW (ref 10–30)

## 2022-03-02 LAB — RESP PANEL BY RT-PCR (FLU A&B, COVID) ARPGX2
Influenza A by PCR: NEGATIVE
Influenza B by PCR: NEGATIVE
SARS Coronavirus 2 by RT PCR: NEGATIVE

## 2022-03-02 LAB — ETHANOL: Alcohol, Ethyl (B): <10 mg/dL (ref <10)

## 2022-03-02 LAB — PROTIME-INR
INR: 1 (ref 0.8–1.2)
Prothrombin Time: 13.5 seconds (ref 11.4–15.2)

## 2022-03-02 LAB — PREGNANCY, URINE: Preg Test, Ur: NEGATIVE

## 2022-03-02 LAB — CBG MONITORING, ED: Glucose-Capillary: 150 mg/dL — ABNORMAL HIGH (ref 70–99)

## 2022-03-02 LAB — VITAMIN B12: Vitamin B-12: 1232 pg/mL — ABNORMAL HIGH (ref 180–914)

## 2022-03-02 MED ORDER — LORAZEPAM 2 MG/ML IJ SOLN
1.0000 mg | Freq: Once | INTRAMUSCULAR | Status: AC
  Administered 2022-03-02: 1 mg via INTRAVENOUS
  Filled 2022-03-02: qty 1

## 2022-03-02 MED ORDER — METFORMIN HCL ER 500 MG PO TB24: 1000.0000 mg | ORAL_TABLET | Freq: Two times a day (BID) | ORAL | Status: DC

## 2022-03-02 MED ORDER — BUSPIRONE HCL 10 MG PO TABS: 10.0000 mg | ORAL_TABLET | Freq: Three times a day (TID) | ORAL | Status: DC

## 2022-03-02 MED ORDER — ASPIRIN 81 MG PO TBEC
81.0000 mg | DELAYED_RELEASE_TABLET | Freq: Every day | ORAL | Status: DC
  Administered 2022-03-02: 81 mg via ORAL
  Filled 2022-03-02: qty 1

## 2022-03-02 MED ORDER — LAMOTRIGINE 100 MG PO TABS
200.0000 mg | ORAL_TABLET | Freq: Two times a day (BID) | ORAL | Status: DC
  Administered 2022-03-02: 200 mg via ORAL
  Filled 2022-03-02: qty 2

## 2022-03-02 MED ORDER — SODIUM CHLORIDE 0.9% FLUSH
3.0000 mL | Freq: Once | INTRAVENOUS | Status: AC
  Administered 2022-03-02: 3 mL via INTRAVENOUS
  Filled 2022-03-02: qty 3

## 2022-03-02 MED ORDER — METFORMIN HCL ER 500 MG PO TB24
1000.0000 mg | ORAL_TABLET | Freq: Two times a day (BID) | ORAL | Status: DC
  Administered 2022-03-02: 1000 mg via ORAL
  Filled 2022-03-02: qty 2

## 2022-03-02 MED ORDER — ACETAMINOPHEN 500 MG PO TABS
1000.0000 mg | ORAL_TABLET | Freq: Four times a day (QID) | ORAL | Status: DC | PRN
Start: 2022-03-02 — End: 2022-03-02

## 2022-03-02 MED ORDER — GADOBUTROL 1 MMOL/ML IV SOLN
7.0000 mL | Freq: Once | INTRAVENOUS | Status: AC | PRN
Start: 2022-03-02 — End: 2022-03-02
  Administered 2022-03-02: 7 mL via INTRAVENOUS

## 2022-03-02 MED ORDER — MONTELUKAST SODIUM 10 MG PO TABS: 10.0000 mg | ORAL_TABLET | Freq: Every day | ORAL | Status: DC

## 2022-03-02 MED ORDER — BASAGLAR KWIKPEN 100 UNIT/ML ~~LOC~~ SOPN: 10.0000 [IU] | PEN_INJECTOR | Freq: Every morning | SUBCUTANEOUS | Status: DC

## 2022-03-02 NOTE — ED Provider Notes (Signed)
[facility] EMERGENCY DEPT Provider Note   CSN: [id number] Arrival date & time: 03/02/22  1115     History  Chief Complaint  Patient presents with   Altered Mental Status    Jessica Bridges is a 56 y.o. female.  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  Husband reports that she has been under a significant amount of stress.  Reports that she just went back to work 2 weeks ago after her recent NSTEMI and her son who had been staying here went back to [facility].  He reports she has a history of depression and is on medications and has an LCSW and friend who are also worried about her.  He states he feels she needs a psychiatry consult.  History is limited by patient on my initial evaluation.  She is able to answer some questions, for example when asked if she had thoughts of hurting herself she replied no.  However when I asked her her name, she is unable to state that.  She is groaning and making different movements in the bed.  She is not able to reliably answer questions to provide history.   Husband denies her complaining of recent headache, nausea, vomiting, diarrhea, cough, fever.  No recent medication changes.  He noted her to appear off balance but had not noticed continuous focal weakness or numbness in one area.  Reports briefly at 1 point she had difficulty moving her right foot, other times she would make abnormal movements of her left arm.  She would arch her back, and move both arms but not have LOC.    She later began to answer more questions, stating "it felt like I was on a loop, I thought they would notice that something was wrong and then I would shake." "I thought I was on the couch in my living room and that they would know something was wrong." She reports she felt like her thoughts were going around in a loop,  that she would start over and wouldn't want anyone to be disappointed or mad at her.  She acknowledges depression. Denies SI. Reports she thought she was in her living room but she denies auditory or visual hallucinations.  Denies alcohol, or drug use.    Past Medical History:  Diagnosis Date   Diabetes mellitus without complication ([place])    Gastric bypass status for obesity    MI (myocardial infarction) ([place]) 12/26/2021   Sleep apnea    CPAP    Home Medications Prior to Admission medications   Medication Sig Start Date End Date Taking? Authorizing Provider  acetaminophen (TYLENOL) 500 MG tablet Take 1,000 mg by mouth every 6 (six) hours as needed for moderate pain.    [provider]  APLENZIN 522 MG TB24 Take 522 mg by mouth daily. 12/20/21   [provider]  aspirin EC 81 MG tablet Take 1 tablet (81 mg total) by mouth daily. Swallow whole. 12/27/21   Cheryln Manly, NP  azelastine (ASTELIN) 0.1 % nasal spray Place 1 spray into both nostrils 2 (two) times daily.    [provider]  bismuth subsalicylate (PEPTO BISMOL) 262 MG/15ML suspension Take 30 mLs by mouth every 4 (four) hours as needed for diarrhea or loose stools or indigestion.    [provider]  busPIRone (BUSPAR) 10 MG tablet Take 10 mg by  mouth 3 (three) times daily. 01/17/22   [provider]  Calcium 250 MG CAPS Take 250 mg by mouth in the morning and at bedtime.    [provider]  Cholecalciferol (VITAMIN D PO) Take 5,000 Units by mouth daily. Patient uses 5000 iu.     [provider]  Insulin Glargine (BASAGLAR KWIKPEN [place]) Inject 10-20 Units into the skin in the morning. Per sliding scale    [provider]  Iron TABS Take 1 tablet by mouth daily.    [provider]  lamoTRIgine (LAMICTAL) 200 MG tablet Take 200 mg by mouth 2 (two) times daily. 01/17/22   [provider]  levocetirizine (XYZAL) 5 MG tablet Take 5 mg by mouth every  evening.    [provider]  metFORMIN (GLUCOPHAGE-XR) 500 MG 24 hr tablet Take 1,000 mg by mouth 2 (two) times daily. 12/21/21   [provider]  Misc Natural Products (JOINT SUPPORT PO) Take 1-2 tablets by mouth daily.    [provider]  montelukast (SINGULAIR) 10 MG tablet Take 10 mg by mouth at bedtime.    [provider]  Multiple Vitamin (MULTIVITAMIN) tablet Take 1 tablet by mouth daily.    [provider]  omeprazole (PRILOSEC) 40 MG capsule Take 40 mg by mouth daily.     [provider]  ondansetron (ZOFRAN) 8 MG tablet Take 8 mg by mouth as needed for nausea or vomiting.    [provider]  OVER THE COUNTER MEDICATION Apply 1 Application topically daily as needed (pain). Medication OTC: Real time cream    [provider]  OVER THE COUNTER MEDICATION Take 1 tablet by mouth daily. Medication: Mitomax    [provider]  rosuvastatin (CRESTOR) 20 MG tablet Take 1 tablet (20 mg total) by mouth daily. 12/27/21   Cheryln Manly, NP  tirzepatide [facility]) 2.5 MG/0.5ML Pen Inject 2.5 mg into the skin once a week.    [provider]  vortioxetine HBr (TRINTELLIX) 10 MG TABS Take 10 mg by mouth in the morning and at bedtime.    [provider]      Allergies    Atorvastatin, Fluvastatin, and Simvastatin    Review of Systems   Review of Systems  Physical Exam Updated Vital Signs BP 138/83   Pulse 93   Temp 98.3 F (36.8 C) (Oral)   Resp 16   Wt 73 kg   SpO2 99%   BMI 25.99 kg/m  Physical Exam Vitals and nursing note reviewed.  Constitutional:      General: She is not in acute distress.    Appearance: She is well-developed. She is not diaphoretic.  HENT:     Head: Normocephalic and atraumatic.  Eyes:     Conjunctiva/sclera: Conjunctivae normal.  Cardiovascular:     Rate and Rhythm: Normal rate and regular rhythm.     Heart sounds: Normal heart sounds. No murmur heard.    No  friction rub. No gallop.  Pulmonary:     Effort: Pulmonary effort is normal. No respiratory distress.     Breath sounds: Normal breath sounds. No wheezing or rales.  Abdominal:     General: There is no distension.     Palpations: Abdomen is soft.     Tenderness: There is no abdominal tenderness. There is no guarding.  Musculoskeletal:        General: No tenderness.     Cervical back: Normal range of motion.  Skin:  General: Skin is warm and dry.     Findings: No erythema or rash.  Neurological:     Mental Status: She is alert.     Comments: Moving right arm with shaking sometimes rhythmic, sometimes not, left arm with occasional shaking, sometimes extends both arms with eyes open and stares, arches back, groans. Not able to state name. Answers no to some questions. Symmetric facies, able to participate in finger to nose, touches nose prior to finger     ED Results / Procedures / Treatments   Labs (all labs ordered are listed, but only abnormal results are displayed) Labs Reviewed  CBC - Abnormal; Notable for the following components:      Result Value   Hemoglobin 11.0 (*)    HCT 33.0 (*)    All other components within normal limits  COMPREHENSIVE METABOLIC PANEL - Abnormal; Notable for the following components:   Sodium 132 (*)    Glucose, Bld 152 (*)    All other components within normal limits  ACETAMINOPHEN LEVEL - Abnormal; Notable for the following components:   Acetaminophen (Tylenol), Serum <10 (*)    All other components within normal limits  SALICYLATE LEVEL - Abnormal; Notable for the following components:   Salicylate Lvl <9.0 (*)    All other components within normal limits  CBG MONITORING, ED - Abnormal; Notable for the following components:   Glucose-Capillary 150 (*)    All other components within normal limits  PROTIME-INR  APTT  DIFFERENTIAL  ETHANOL  PREGNANCY, URINE  RAPID URINE DRUG SCREEN, HOSP PERFORMED  VITAMIN B12  VITAMIN B1  CBG  MONITORING, ED    EKG EKG Interpretation  Date/Time:  Friday March 02 2022 11:31:11 EDT Ventricular Rate:  104 PR Interval:  154 QRS Duration: 90 QT Interval:  322 QTC Calculation: 424 R Axis:   55 Text Interpretation: Sinus tachycardia Abnormal R-wave progression, early transition No significant change since last tracing Confirmed by Gareth Morgan [phone]) on 03/02/2022 3:07:08 PM  Radiology MR Brain W and Wo Contrast  Result Date: 03/02/2022 CLINICAL DATA:  Altered mental status, nontraumatic; aphasia EXAM: MRI HEAD WITHOUT AND WITH CONTRAST TECHNIQUE: Multiplanar, multiecho pulse sequences of the brain and surrounding structures were obtained without and with intravenous contrast. CONTRAST:  30m GADAVIST GADOBUTROL 1 MMOL/ML IV SOLN COMPARISON:  None Available. FINDINGS: Brain: There is no acute infarction or intracranial hemorrhage. There is no intracranial mass, mass effect, or edema. There is no hydrocephalus or extra-axial fluid collection. Ventricles and sulci are within normal limits in size and configuration. No abnormal enhancement. Vascular: Major vessel flow voids at the skull base are preserved. Skull and upper cervical spine: Normal marrow signal is preserved. Sinuses/Orbits: Paranasal sinuses are aerated. Orbits are unremarkable. Other: Sella is unremarkable.  Mastoid air cells are clear. IMPRESSION: No evidence of recent infarction, hemorrhage, or mass. No abnormal enhancement. Electronically Signed   By: PMacy MisM.D.   On: 03/02/2022 15:31   CT HEAD WO CONTRAST  Result Date: 03/02/2022 CLINICAL DATA:  Neuro deficit, acute stroke suspected. Patient having a lot of involuntary movements. Husband states patient started acting strangely last night. EXAM: CT HEAD WITHOUT CONTRAST TECHNIQUE: Contiguous axial images were obtained from the base of the skull through the vertex without intravenous contrast. RADIATION DOSE REDUCTION: This exam was performed according to the  departmental dose-optimization program which includes automated exposure control, adjustment of the mA and/or kV according to patient size and/or use of iterative reconstruction technique. COMPARISON:  None  Available. FINDINGS: Brain: No evidence of acute infarction, hemorrhage, hydrocephalus, extra-axial collection or mass lesion/mass effect. Vascular: No hyperdense vessel or unexpected calcification. Skull: Normal. Negative for fracture or focal lesion. Sinuses/Orbits: No acute finding. Other: None. IMPRESSION: No acute intracranial abnormality. MRI examination could be considered for further evaluation if clinically warranted. Electronically Signed   By: Keane Police D.O.   On: 03/02/2022 12:30    Procedures Procedures    Medications Ordered in ED Medications  sodium chloride flush (NS) 0.9 % injection 3 mL (3 mLs Intravenous Given 03/02/22 1130)  LORazepam (ATIVAN) injection 1 mg (1 mg Intravenous Given 03/02/22 1313)  gadobutrol (GADAVIST) 1 MMOL/ML injection 7 mL (7 mLs Intravenous Contrast Given 03/02/22 1451)    ED Course/ Medical Decision Making/ A&P                           Medical Decision Making Amount and/or Complexity of Data Reviewed Labs: ordered. Radiology: ordered.  Risk Prescription drug management.   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  Differential diagnosis includes anemia, electrolyte abnormality, cardiac abnormality, infection, hypothyroidism, other toxic/metabolic abnormalities, tumor, seizure, ICH, CVA.   CT head completed and shows no sign of acute intracranial abnormality.  Given concern for aphasia, MRI brain with and without contrast was ordered which showed no evidence of acute abnormalities.  She is hemodynamically stable, afebrile, no leukocytosis, have low suspicion that clinical presentation represents a meningitis  or encephalitis.  She is noted to have abnormal movements of her body, bilateral upper and lower extremities, with presentation not consistent with focal seizure, generalized seizure, and overall does not appear consistent with akathisia.  She has not had any recent medication changes to cause a medication effect.  CBC showed no no sign of anemia.  Electrolytes WNL.  EKG without significant changes.  She has improved after time in the ED, and ativan she received prior to MRI. I do not feel her presentation is consistent with seizure given several areas of involvement.  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  She denies SI.  Will consult TTS for further evaluation and consideration of hospitalization.         Final Clinical Impression(s) / ED Diagnoses Final diagnoses:  Altered mental status, unspecified altered mental status type  Stress reaction  Depression, unspecified depression type    Rx / DC Orders ED Discharge Orders     None         Gareth Morgan, MD 03/02/22 1658

## 2022-03-02 NOTE — ED Notes (Signed)
Patient transported to MRI. Unable to validate VS due to same.

## 2022-03-02 NOTE — ED Notes (Signed)
Patient transported with [facility] en route to [facility]. Patient's belongings sent home with patient's husband.

## 2022-03-02 NOTE — ED Triage Notes (Signed)
Patient's husband reports she was acting strangely last night and this morning she came in and got him and told him something was wrong. Last seen normal around 6pm yesterday. Patient felt like she was in a loop and kept repeating herself and was having difficulty walking. Patient is a diabetic.

## 2022-03-02 NOTE — ED Provider Notes (Signed)
According to previous provider documentation and report, patient is a medically cleared for psychiatric evaluation.  Psychiatry wants to have her transferred to the [facility] for further evaluation and management.  Anticipate transfer tonight for further management.   Jessica Bridges, Jessica Allegra, MD 03/02/22 2258

## 2022-03-02 NOTE — BH Assessment (Signed)
Leandro Reasoner, NP has accepted Pt for continuous assessment at [facility]. Notified Dr Harrell Gave Tegeler and Newman Nickels, RN of acceptance. Number for RN report is [phone].   Evelena Peat, [facility], [facility] Triage Specialist [phone]

## 2022-03-02 NOTE — ED Notes (Signed)
TTS at bedside for assessment

## 2022-03-02 NOTE — ED Notes (Signed)
Patient given snacks and drink. Patient's husband at patient bedside.
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
## 2022-03-03 ENCOUNTER — Ambulatory Visit (HOSPITAL_COMMUNITY)
Admission: EM | Admit: 2022-03-03 | Discharge: 2022-03-03 | Disposition: A | Discharge status: Home / Self Care | Attending: Family | Admitting: Family

## 2022-03-03 DIAGNOSIS — F411 Generalized anxiety disorder: Secondary | ICD-10-CM | POA: Diagnosis not present

## 2022-03-03 LAB — URINALYSIS, ROUTINE W REFLEX MICROSCOPIC
Bilirubin Urine: NEGATIVE
Glucose, UA: >=500 mg/dL — AB
Hgb urine dipstick: NEGATIVE
Ketones, ur: 20 mg/dL — AB
Nitrite: NEGATIVE
Protein, ur: NEGATIVE mg/dL
Specific Gravity, Urine: 1.018 (ref 1.005–1.030)
pH: 5 (ref 5.0–8.0)

## 2022-03-03 MED ORDER — ALUM & MAG HYDROXIDE-SIMETH 200-200-20 MG/5ML PO SUSP
30.0000 mL | ORAL | Status: DC | PRN
  Administered 2022-03-03: 30 mL via ORAL
  Filled 2022-03-03: qty 30

## 2022-03-03 MED ORDER — METFORMIN HCL ER 500 MG PO TB24
1000.0000 mg | ORAL_TABLET | Freq: Two times a day (BID) | ORAL | Status: DC
  Administered 2022-03-03: 1000 mg via ORAL
  Filled 2022-03-03: qty 2

## 2022-03-03 MED ORDER — ACETAMINOPHEN 325 MG PO TABS
650.0000 mg | ORAL_TABLET | Freq: Four times a day (QID) | ORAL | Status: DC | PRN
  Administered 2022-03-03: 650 mg via ORAL
  Filled 2022-03-03: qty 2

## 2022-03-03 MED ORDER — MAGNESIUM HYDROXIDE 400 MG/5ML PO SUSP: 30.0000 mL | Freq: Every day | ORAL | Status: DC | PRN

## 2022-03-03 MED ORDER — BASAGLAR KWIKPEN 100 UNIT/ML ~~LOC~~ SOPN: 10.0000 [IU] | PEN_INJECTOR | Freq: Every morning | SUBCUTANEOUS | Status: DC

## 2022-03-03 MED ORDER — BUSPIRONE HCL 5 MG PO TABS
10.0000 mg | ORAL_TABLET | Freq: Three times a day (TID) | ORAL | Status: DC
  Administered 2022-03-03: 10 mg via ORAL
  Filled 2022-03-03: qty 2

## 2022-03-03 MED ORDER — PANTOPRAZOLE SODIUM 40 MG PO TBEC
40.0000 mg | DELAYED_RELEASE_TABLET | Freq: Every day | ORAL | Status: DC
  Administered 2022-03-03: 40 mg via ORAL
  Filled 2022-03-03: qty 1

## 2022-03-03 MED ORDER — MONTELUKAST SODIUM 10 MG PO TABS
10.0000 mg | ORAL_TABLET | Freq: Every day | ORAL | Status: DC
Start: 2022-03-03 — End: 2022-03-03

## 2022-03-03 MED ORDER — LAMOTRIGINE 100 MG PO TABS
200.0000 mg | ORAL_TABLET | Freq: Two times a day (BID) | ORAL | Status: DC
  Administered 2022-03-03: 200 mg via ORAL
  Filled 2022-03-03: qty 2

## 2022-03-03 MED ORDER — INSULIN GLARGINE-YFGN 100 UNIT/ML ~~LOC~~ SOLN: 10.0000 [IU] | Freq: Every day | SUBCUTANEOUS | Status: DC

## 2022-03-03 NOTE — ED Provider Notes (Signed)
[facility] Continuous Assessment Admission H&P  Date: 03/03/22 Patient Name: Jessica Bridges MRN: [id number] Chief Complaint:  Chief Complaint  Patient presents with   Altered Mental Status      Diagnoses:  Final diagnoses:  None    HPI: Jessica Bridges is a psychiatric history of anxiety and depression.  Patient initially presented concerning to [facility] with c/o of altered mental status; she was evaluated and recommended for continuous assessment at [facility].   Patient was seen face to face by  this NP upon her arrival to [facility]. She is alert and oriented to self and place. She reports that she came to the hospital because she is experiencing "a mental block and I feel stuck in my mind." Patient is unable to provide further detailed about her symptoms and she reports she can not remember event from the past several days. She appears to be having difficulties processing assessment questions and she would provide inappropriate response to questions despite redirections and writer asking simple questions like "how are you feeling, can you tell me what's going on with you, how can I help you?" She denies SI, HI, hallucination, paranoia, and substance abuse.    PHQ 2-9:   [place] ED from 03/03/2022 in [facility] ED from 03/02/2022 in [facility] ED from 01/05/2022 in [place] No Risk No Risk No Risk        Total Time spent with patient: 20 minutes  Musculoskeletal  Strength & Muscle Tone: within normal limits Gait & Station: unsteady Patient leans: Right  Psychiatric Specialty Exam  Presentation General Appearance: Appropriate for Environment  Eye Contact:Good  Speech:Slow  Speech Volume:Normal  Handedness:Right   Mood and Affect  Mood:Anxious  Affect:Congruent   Thought Process  Thought Processes:Disorganized  Descriptions of  Associations:Loose  Orientation:Partial  Thought Content:WDL  Diagnosis of Schizophrenia or Schizoaffective disorder in past: No   Hallucinations:Hallucinations: None  Ideas of Reference:None  Suicidal Thoughts:Suicidal Thoughts: No  Homicidal Thoughts:Homicidal Thoughts: No   Sensorium  Memory:Immediate Poor; Recent Poor; Remote Poor  Judgment:Impaired  Insight:Lacking   Executive Functions  Concentration:Fair  Attention Span:Fair  Recall:Poor  Fund of Knowledge:Poor  Language:Poor   Psychomotor Activity  Psychomotor Activity:Psychomotor Activity: Normal   Assets  Assets:Desire for Improvement; Social Support   Sleep  Sleep:Sleep: Poor (4 hours per day)   Nutritional Assessment (For OBS and FBC admissions only) Has the patient had a weight loss or gain of 10 pounds or more in the last 3 months?: No Has the patient had a decrease in food intake/or appetite?: No Does the patient have dental problems?: No Does the patient have eating habits or behaviors that may be indicators of an eating disorder including binging or inducing vomiting?: No Has the patient recently lost weight without trying?: 0 Has the patient been eating poorly because of a decreased appetite?: 0 Malnutrition Screening Tool Score: 0    Physical Exam Vitals and nursing note reviewed.  Constitutional:      General: She is not in acute distress.    Appearance: She is well-developed. She is not ill-appearing.  HENT:     Head: Normocephalic and atraumatic.  Eyes:     Conjunctiva/sclera: Conjunctivae normal.  Cardiovascular:     Rate and Rhythm: Normal rate.  Pulmonary:     Effort: Pulmonary effort is normal. No respiratory distress.  Abdominal:     Palpations: Abdomen is soft.  Tenderness: There is no abdominal tenderness.  Musculoskeletal:        General: No swelling.     Cervical back: Normal range of motion.  Skin:    General: Skin is warm and dry.  Neurological:      Mental Status: She is alert and oriented to person, place, and time.  Psychiatric:        Attention and Perception: Attention and perception normal.        Mood and Affect: Mood is depressed.        Speech: Speech is delayed.        Behavior: Behavior is slowed.        Thought Content: Thought content normal.        Cognition and Memory: Cognition is impaired.    Review of Systems  Constitutional: Negative.   HENT: Negative.    Eyes: Negative.   Respiratory: Negative.    Cardiovascular: Negative.   Gastrointestinal: Negative.   Genitourinary: Negative.   Musculoskeletal: Negative.   Skin: Negative.   Neurological: Negative.   Endo/Heme/Allergies: Negative.   Psychiatric/Behavioral:  The patient is nervous/anxious.     Blood pressure 127/77, pulse 94, temperature 98.3 F (36.8 C), temperature source Oral, resp. rate 20, SpO2 99 %. There is no height or weight on file to calculate BMI.  Past Psychiatric History:    Is the patient at risk to self? No  Has the patient been a risk to self in the past 6 months? No .    Has the patient been a risk to self within the distant past? No   Is the patient a risk to others? No   Has the patient been a risk to others in the past 6 months? No   Has the patient been a risk to others within the distant past? No   Past Medical History:  Past Medical History:  Diagnosis Date   Diabetes mellitus without complication ([place])    Gastric bypass status for obesity    MI (myocardial infarction) ([place]) 12/26/2021   Sleep apnea    CPAP    Past Surgical History:  Procedure Laterality Date   BREAST BIOPSY     right   CHOLECYSTECTOMY     CORONARY/GRAFT ACUTE MI REVASCULARIZATION N/A 12/24/2021   Procedure: Coronary/Graft Acute MI Revascularization;  Surgeon: [name], MD;  Location: [facility];  Service: Cardiovascular;  Laterality: N/A;   DENTAL RESTORATION/EXTRACTION WITH X-RAY  12/2016   GASTRIC BYPASS     LEFT HEART CATH AND  CORONARY ANGIOGRAPHY N/A 12/24/2021   Procedure: LEFT HEART CATH AND CORONARY ANGIOGRAPHY;  Surgeon: [name], MD;  Location: [facility];  Service: Cardiovascular;  Laterality: N/A;   LIVER BIOPSY     WISDOM TOOTH EXTRACTION      Family History:  Family History  Problem Relation Age of Onset   Colon cancer Neg Hx     Social History:  Social History   Socioeconomic History   Marital status: Married    Spouse name: Not on file   Number of children: Not on file   Years of education: Not on file   Highest education level: Not on file  Occupational History   Not on file  Tobacco Use   Smoking status: Never   Smokeless tobacco: Never  Substance and Sexual Activity   Alcohol use: No   Drug use: No   Sexual activity: Not on file  Other Topics Concern   Not on file  Social History Narrative   Not on file   Social Determinants of Health   Financial Resource Strain: Not on file  Food Insecurity: Not on file  Transportation Needs: Not on file  Physical Activity: Not on file  Stress: Not on file  Social Connections: Not on file  Intimate Partner Violence: Not on file    SDOH:  SDOH Screenings   Alcohol Screen: Not on file  Depression (PHQ2-9): Not on file  Financial Resource Strain: Not on file  Food Insecurity: Not on file  Housing: Not on file  Physical Activity: Not on file  Social Connections: Not on file  Stress: Not on file  Tobacco Use: Low Risk  (03/02/2022)   Patient History    Smoking Tobacco Use: Never    Smokeless Tobacco Use: Never    Passive Exposure: Not on file  Transportation Needs: Not on file    Last Labs:  Admission on 03/02/2022, Discharged on 03/02/2022  Component Date Value Ref Range Status   Glucose-Capillary 03/02/2022 150 (H)  70 - 99 mg/dL Final   Glucose reference range applies only to samples taken after fasting for at least 8 hours.   Prothrombin Time 03/02/2022 13.5  11.4 - 15.2 seconds Final   INR 03/02/2022 1.0   0.8 - 1.2 Final   Comment: (NOTE) INR goal varies based on device and disease states. Performed at [facility], [address] [place], [place] [address]    aPTT 03/02/2022 32  24 - 36 seconds Final   Performed at [facility], [address] [place], [place] [address]   WBC 03/02/2022 7.9  4.0 - 10.5 K/uL Final   RBC 03/02/2022 4.12  3.87 - 5.11 MIL/uL Final   Hemoglobin 03/02/2022 11.0 (L)  12.0 - 15.0 g/dL Final   HCT 03/02/2022 33.0 (L)  36.0 - 46.0 % Final   MCV 03/02/2022 80.1  80.0 - 100.0 fL Final   MCH 03/02/2022 26.7  26.0 - 34.0 pg Final   MCHC 03/02/2022 33.3  30.0 - 36.0 g/dL Final   RDW 03/02/2022 14.9  11.5 - 15.5 % Final   Platelets 03/02/2022 300  150 - 400 K/uL Final   nRBC 03/02/2022 0.0  0.0 - 0.2 % Final   Performed at [facility], [address] [place], [place] [address]   Neutrophils Relative % 03/02/2022 56  % Final   Neutro Abs 03/02/2022 4.4  1.7 - 7.7 K/uL Final   Lymphocytes Relative 03/02/2022 38  % Final   Lymphs Abs 03/02/2022 3.0  0.7 - 4.0 K/uL Final   Monocytes Relative 03/02/2022 5  % Final   Monocytes Absolute 03/02/2022 0.4  0.1 - 1.0 K/uL Final   Eosinophils Relative 03/02/2022 1  % Final   Eosinophils Absolute 03/02/2022 0.1  0.0 - 0.5 K/uL Final   Basophils Relative 03/02/2022 0  % Final   Basophils Absolute 03/02/2022 0.0  0.0 - 0.1 K/uL Final   Immature Granulocytes 03/02/2022 0  % Final   Abs Immature Granulocytes 03/02/2022 0.02  0.00 - 0.07 K/uL Final   Performed at [facility], [place], [place] [address]   Sodium 03/02/2022 132 (L)  135 - 145 mmol/L Final   Potassium 03/02/2022 4.7  3.5 - 5.1 mmol/L Final   Chloride 03/02/2022 99  98 - 111 mmol/L Final   CO2 03/02/2022 24  22 - 32 mmol/L Final   Glucose, Bld 03/02/2022 152 (H)  70 - 99 mg/dL Final  Glucose reference range applies only to samples taken after fasting for at least 8  hours.   BUN 03/02/2022 9  6 - 20 mg/dL Final   Creatinine, Ser 03/02/2022 0.82  0.44 - 1.00 mg/dL Final   Calcium 03/02/2022 9.7  8.9 - 10.3 mg/dL Final   Total Protein 03/02/2022 7.6  6.5 - 8.1 g/dL Final   Albumin 03/02/2022 4.6  3.5 - 5.0 g/dL Final   AST 03/02/2022 26  15 - 41 U/L Final   ALT 03/02/2022 26  0 - 44 U/L Final   Alkaline Phosphatase 03/02/2022 63  38 - 126 U/L Final   Total Bilirubin 03/02/2022 0.3  0.3 - 1.2 mg/dL Final   GFR, Estimated 03/02/2022 >60  >60 mL/min Final   Comment: (NOTE) Calculated using the CKD-EPI Creatinine Equation (2021)    Anion gap 03/02/2022 9  5 - 15 Final   Performed at [facility], [address], [place], [place] [address]   Alcohol, Ethyl (B) 03/02/2022 <10  <10 mg/dL Final   Comment: (NOTE) Lowest detectable limit for serum alcohol is 10 mg/dL.  For medical purposes only. Performed at [facility], [address] [place], [place] [address]    Preg Test, Ur 03/02/2022 NEGATIVE  NEGATIVE Final   Comment:        THE SENSITIVITY OF THIS METHODOLOGY IS >20 mIU/mL. Performed at [facility], [address], [place], [place] [address]    Vitamin B-12 03/02/2022 1,232 (H)  180 - 914 pg/mL Final   Comment: (NOTE) This assay is not validated for testing neonatal or myeloproliferative syndrome specimens for Vitamin B12 levels. Performed at [facility], [place] [address], [place], [place] [address]    Opiates 03/02/2022 NONE DETECTED  NONE DETECTED Final   Cocaine 03/02/2022 NONE DETECTED  NONE DETECTED Final   Benzodiazepines 03/02/2022 NONE DETECTED  NONE DETECTED Final   Amphetamines 03/02/2022 NONE DETECTED  NONE DETECTED Final   Tetrahydrocannabinol 03/02/2022 NONE DETECTED  NONE DETECTED Final   Barbiturates 03/02/2022 NONE DETECTED  NONE DETECTED Final   Comment: (NOTE) DRUG SCREEN FOR MEDICAL PURPOSES ONLY.  IF CONFIRMATION IS NEEDED FOR ANY PURPOSE, NOTIFY  LAB WITHIN 5 DAYS.  LOWEST DETECTABLE LIMITS FOR URINE DRUG SCREEN Drug Class                     Cutoff (ng/mL) Amphetamine and metabolites    1000 Barbiturate and metabolites    200 Benzodiazepine                 735 Tricyclics and metabolites     300 Opiates and metabolites        300 Cocaine and metabolites        300 THC                            50 Performed at [facility], [place], [place] [address]    Acetaminophen (Tylenol), Serum 03/02/2022 <10 (L)  10 - 30 ug/mL Final   Comment: (NOTE) Therapeutic concentrations vary significantly. A range of 10-30 ug/mL  may be an effective concentration for many patients. However, some  are best treated at concentrations outside of this range. Acetaminophen concentrations >150 ug/mL at 4 hours after ingestion  and >50 ug/mL at 12 hours after ingestion are often associated with  toxic reactions.  Performed at [facility], [address], [place],  [place] [address]    Salicylate Lvl 85/27/7824 <7.0 (L)  7.0 - 30.0 mg/dL Final   Performed at [facility], [address], [place], [place] [address]   SARS Coronavirus 2 by RT PCR 03/02/2022 NEGATIVE  NEGATIVE Final   Comment: (NOTE) SARS-CoV-2 target nucleic acids are NOT DETECTED.  The SARS-CoV-2 RNA is generally detectable in upper respiratory specimens during the acute phase of infection. The lowest concentration of SARS-CoV-2 viral copies this assay can detect is 138 copies/mL. A negative result does not preclude SARS-Cov-2 infection and should not be used as the sole basis for treatment or other patient management decisions. A negative result may occur with  improper specimen collection/handling, submission of specimen other than nasopharyngeal swab, presence of viral mutation(s) within the areas targeted by this assay, and inadequate number of viral copies(<138 copies/mL). A negative result must be  combined with clinical observations, patient history, and epidemiological information. The expected result is Negative.  Fact Sheet for Patients:  EntrepreneurPulse.com.au  Fact Sheet for Healthcare Providers:  IncredibleEmployment.be  This test is no                          t yet approved or cleared by the [place] FDA and  has been authorized for detection and/or diagnosis of SARS-CoV-2 by FDA under an Emergency Use Authorization (EUA). This EUA will remain  in effect (meaning this test can be used) for the duration of the COVID-19 declaration under Section 564(b)(1) of the Act, 21 U.S.C.section 360bbb-3(b)(1), unless the authorization is terminated  or revoked sooner.       Influenza A by PCR 03/02/2022 NEGATIVE  NEGATIVE Final   Influenza B by PCR 03/02/2022 NEGATIVE  NEGATIVE Final   Comment: (NOTE) The Xpert Xpress SARS-CoV-2/FLU/RSV plus assay is intended as an aid in the diagnosis of influenza from Nasopharyngeal swab specimens and should not be used as a sole basis for treatment. Nasal washings and aspirates are unacceptable for Xpert Xpress SARS-CoV-2/FLU/RSV testing.  Fact Sheet for Patients: EntrepreneurPulse.com.au  Fact Sheet for Healthcare Providers: IncredibleEmployment.be  This test is not yet approved or cleared by the [place] FDA and has been authorized for detection and/or diagnosis of SARS-CoV-2 by FDA under an Emergency Use Authorization (EUA). This EUA will remain in effect (meaning this test can be used) for the duration of the COVID-19 declaration under Section 564(b)(1) of the Act, 21 U.S.C. section 360bbb-3(b)(1), unless the authorization is terminated or revoked.  Performed at [facility], [address] [place], [place] [address]   Lab on 02/21/2022  Component Date Value Ref Range Status   WBC 02/21/2022 9.3  3.4 - 10.8 x10E3/uL Final    RBC 02/21/2022 4.30  3.77 - 5.28 x10E6/uL Final   Hemoglobin 02/21/2022 11.4  11.1 - 15.9 g/dL Final   Hematocrit 02/21/2022 34.4  34.0 - 46.6 % Final   MCV 02/21/2022 80  79 - 97 fL Final   MCH 02/21/2022 26.5 (L)  26.6 - 33.0 pg Final   MCHC 02/21/2022 33.1  31.5 - 35.7 g/dL Final   RDW 02/21/2022 14.8  11.7 - 15.4 % Final   Platelets 02/21/2022 334  150 - 450 x10E3/uL Final   Total Protein 02/21/2022 7.3  6.0 - 8.5 g/dL Final   Albumin 02/21/2022 4.9  3.8 - 4.9 g/dL Final   Bilirubin Total 02/21/2022 0.3  0.0 - 1.2 mg/dL Final   Bilirubin, Direct 02/21/2022 0.12  0.00 - 0.40  mg/dL Final   Alkaline Phosphatase 02/21/2022 78  44 - 121 IU/L Final   AST 02/21/2022 30  0 - 40 IU/L Final   ALT 02/21/2022 26  0 - 32 IU/L Final   Cholesterol, Total 02/21/2022 117  100 - 199 mg/dL Final   Triglycerides 02/21/2022 78  0 - 149 mg/dL Final   HDL 02/21/2022 78  >39 mg/dL Final   VLDL Cholesterol Cal 02/21/2022 16  5 - 40 mg/dL Final   LDL Chol Calc (NIH) 02/21/2022 23  0 - 99 mg/dL Final   Chol/HDL Ratio 02/21/2022 1.5  0.0 - 4.4 ratio Final   Comment:                                   T. Chol/HDL Ratio                                             Men  Women                               1/2 Avg.Risk  3.4    3.3                                   Avg.Risk  5.0    4.4                                2X Avg.Risk  9.6    7.1                                3X Avg.Risk 23.4   11.0    Glucose 02/21/2022 91  70 - 99 mg/dL Final   BUN 02/21/2022 12  6 - 24 mg/dL Final   Creatinine, Ser 02/21/2022 0.92  0.57 - 1.00 mg/dL Final   eGFR 02/21/2022 74  >59 mL/min/1.73 Final   BUN/Creatinine Ratio 02/21/2022 13  9 - 23 Final   Sodium 02/21/2022 131 (L)  134 - 144 mmol/L Final   Potassium 02/21/2022 4.7  3.5 - 5.2 mmol/L Final   Chloride 02/21/2022 93 (L)  96 - 106 mmol/L Final   CO2 02/21/2022 20  20 - 29 mmol/L Final   Calcium 02/21/2022 10.2  8.7 - 10.2 mg/dL Final  Admission on 01/05/2022, Discharged  on 01/05/2022  Component Date Value Ref Range Status   WBC 01/05/2022 8.3  4.0 - 10.5 K/uL Final   RBC 01/05/2022 3.33 (L)  3.87 - 5.11 MIL/uL Final   Hemoglobin 01/05/2022 9.0 (L)  12.0 - 15.0 g/dL Final   HCT 01/05/2022 27.2 (L)  36.0 - 46.0 % Final   MCV 01/05/2022 81.7  80.0 - 100.0 fL Final   MCH 01/05/2022 27.0  26.0 - 34.0 pg Final   MCHC 01/05/2022 33.1  30.0 - 36.0 g/dL Final   RDW 01/05/2022 14.1  11.5 - 15.5 % Final   Platelets 01/05/2022 294  150 - 400 K/uL Final   nRBC 01/05/2022 0.0  0.0 - 0.2 % Final   Neutrophils Relative % 01/05/2022 58  % Final  Neutro Abs 01/05/2022 4.9  1.7 - 7.7 K/uL Final   Lymphocytes Relative 01/05/2022 33  % Final   Lymphs Abs 01/05/2022 2.7  0.7 - 4.0 K/uL Final   Monocytes Relative 01/05/2022 5  % Final   Monocytes Absolute 01/05/2022 0.4  0.1 - 1.0 K/uL Final   Eosinophils Relative 01/05/2022 2  % Final   Eosinophils Absolute 01/05/2022 0.1  0.0 - 0.5 K/uL Final   Basophils Relative 01/05/2022 1  % Final   Basophils Absolute 01/05/2022 0.0  0.0 - 0.1 K/uL Final   Immature Granulocytes 01/05/2022 1  % Final   Abs Immature Granulocytes 01/05/2022 0.04  0.00 - 0.07 K/uL Final   Performed at [facility], [place] [address] [place], [place] [address]   Sodium 01/05/2022 131 (L)  135 - 145 mmol/L Final   Potassium 01/05/2022 3.9  3.5 - 5.1 mmol/L Final   Chloride 01/05/2022 98  98 - 111 mmol/L Final   CO2 01/05/2022 19 (L)  22 - 32 mmol/L Final   Glucose, Bld 01/05/2022 144 (H)  70 - 99 mg/dL Final   Glucose reference range applies only to samples taken after fasting for at least 8 hours.   BUN 01/05/2022 8  6 - 20 mg/dL Final   Creatinine, Ser 01/05/2022 0.88  0.44 - 1.00 mg/dL Final   Calcium 01/05/2022 8.9  8.9 - 10.3 mg/dL Final   Total Protein 01/05/2022 6.2 (L)  6.5 - 8.1 g/dL Final   Albumin 01/05/2022 3.7  3.5 - 5.0 g/dL Final   AST 01/05/2022 25  15 - 41 U/L Final   ALT 01/05/2022 26  0 - 44 U/L Final   Alkaline Phosphatase  01/05/2022 54  38 - 126 U/L Final   Total Bilirubin 01/05/2022 0.5  0.3 - 1.2 mg/dL Final   GFR, Estimated 01/05/2022 >60  >60 mL/min Final   Comment: (NOTE) Calculated using the CKD-EPI Creatinine Equation (2021)    Anion gap 01/05/2022 14  5 - 15 Final   Performed at [facility] [address], [place], [place] [address]   Troponin I (High Sensitivity) 01/05/2022 4  <18 ng/L Final   Comment: (NOTE) Elevated high sensitivity troponin I (hsTnI) values and significant  changes across serial measurements may suggest ACS but many other  chronic and acute conditions are known to elevate hsTnI results.  Refer to the "Links" section for chest pain algorithms and additional  guidance. Performed at [facility], [place] [address] [place], [place] [address]    D-Dimer, Quant 01/05/2022 0.30  0.00 - 0.50 ug/mL-FEU Final   Comment: (NOTE) At the manufacturer cut-off value of 0.5 g/mL FEU, this assay has a negative predictive value of 95-100%.This assay is intended for use in conjunction with a clinical pretest probability (PTP) assessment model to exclude pulmonary embolism (PE) and deep venous thrombosis (DVT) in outpatients suspected of PE or DVT. Results should be correlated with clinical presentation. Performed at [facility], [place] [address], [place], [place] [address]    Lipase 01/05/2022 29  11 - 51 U/L Final   Performed at [facility] [address], [place], [place] [address]   B Natriuretic Peptide 01/05/2022 75.1  0.0 - 100.0 pg/mL Final   Performed at [facility] [address] [place], [place] [address]   Troponin I (High Sensitivity) 01/05/2022 5  <18 ng/L Final   Comment: (NOTE) Elevated high sensitivity troponin I (hsTnI) values and  significant  changes across serial measurements may suggest ACS but many other  chronic and acute conditions are known to elevate hsTnI results.  Refer to the "Links" section for chest pain algorithms and  additional  guidance. Performed at [facility], [place] [address], [place], [place] [address]   Admission on 12/23/2021, Discharged on 12/26/2021  Component Date Value Ref Range Status   Sodium 12/23/2021 131 (L)  135 - 145 mmol/L Final   Potassium 12/23/2021 3.7  3.5 - 5.1 mmol/L Final   Chloride 12/23/2021 100  98 - 111 mmol/L Final   CO2 12/23/2021 19 (L)  22 - 32 mmol/L Final   Glucose, Bld 12/23/2021 184 (H)  70 - 99 mg/dL Final   Glucose reference range applies only to samples taken after fasting for at least 8 hours.   BUN 12/23/2021 10  6 - 20 mg/dL Final   Creatinine, Ser 12/23/2021 0.88  0.44 - 1.00 mg/dL Final   Calcium 12/23/2021 9.8  8.9 - 10.3 mg/dL Final   GFR, Estimated 12/23/2021 >60  >60 mL/min Final   Comment: (NOTE) Calculated using the CKD-EPI Creatinine Equation (2021)    Anion gap 12/23/2021 12  5 - 15 Final   Performed at [facility], [address], [place], [place] [address]   WBC 12/23/2021 10.1  4.0 - 10.5 K/uL Final   RBC 12/23/2021 3.88  3.87 - 5.11 MIL/uL Final   Hemoglobin 12/23/2021 10.2 (L)  12.0 - 15.0 g/dL Final   HCT 12/23/2021 30.8 (L)  36.0 - 46.0 % Final   MCV 12/23/2021 79.4 (L)  80.0 - 100.0 fL Final   MCH 12/23/2021 26.3  26.0 - 34.0 pg Final   MCHC 12/23/2021 33.1  30.0 - 36.0 g/dL Final   RDW 12/23/2021 15.0  11.5 - 15.5 % Final   Platelets 12/23/2021 303  150 - 400 K/uL Final   nRBC 12/23/2021 0.0  0.0 - 0.2 % Final   Performed at [facility], [address], [place], [place] [address]   Troponin I (High Sensitivity) 12/23/2021 605 (HH)  <18 ng/L Final   Comment: CRITICAL RESULT CALLED TO, READ BACK BY AND VERIFIED WITH: BAILEY LOSORIO 12/23/21 AT 2157 HS (NOTE) Elevated high sensitivity troponin I (hsTnI) values and significant  changes across serial measurements may suggest ACS but many other  chronic and acute conditions are known to elevate hsTnI results.  Refer to the Links section  for chest pain algorithms and additional  guidance. Performed at [facility], [place], [place] [address]    Troponin I (High Sensitivity) 12/23/2021 1,456 (HH)  <18 ng/L Final   Comment: CRITICAL VALUE NOTED.  VALUE IS CONSISTENT WITH PREVIOUSLY REPORTED AND CALLED VALUE. (NOTE) Elevated high sensitivity troponin I (hsTnI) values and significant  changes across serial measurements may suggest ACS but many other  chronic and acute conditions are known to elevate hsTnI results.  Refer to the Links section for chest pain algorithms and additional  guidance. Performed at [facility], [address], [place], [place] [address]    Glucose-Capillary 12/24/2021 147 (H)  70 - 99 mg/dL Final   Glucose reference range applies only to samples taken after fasting for at least 8 hours.   Heparin Unfractionated 12/24/2021 0.18 (L)  0.30 - 0.70 IU/mL Final   Comment: (NOTE) The clinical reportable range upper limit is being lowered to >1.10 to align with the FDA approved guidance for the current laboratory assay.  If heparin results are below expected values, and patient dosage has  been confirmed, suggest follow up testing of antithrombin III levels. Performed at [facility], [place] [address], [place], [place] [address]    WBC 12/24/2021 9.0  4.0 - 10.5 K/uL Final   RBC 12/24/2021 3.43 (L)  3.87 - 5.11 MIL/uL Final   Hemoglobin 12/24/2021 9.4 (L)  12.0 - 15.0 g/dL Final   HCT 12/24/2021 27.6 (L)  36.0 - 46.0 % Final   MCV 12/24/2021 80.5  80.0 - 100.0 fL Final   MCH 12/24/2021 27.4  26.0 - 34.0 pg Final   MCHC 12/24/2021 34.1  30.0 - 36.0 g/dL Final   RDW 12/24/2021 15.1  11.5 - 15.5 % Final   Platelets 12/24/2021 275  150 - 400 K/uL Final   nRBC 12/24/2021 0.0  0.0 - 0.2 % Final   Performed at [facility] [address] [place], [place] [address]   HIV Screen 4th Generation wRfx 12/24/2021 Non Reactive  Non Reactive  Final   Performed at [facility], [place] [address] [place], [place] [address]   Lipoprotein (a) 12/24/2021 64.7 (H)  <75.0 nmol/L Final   Comment: (NOTE) Note:  Values greater than or equal to 75.0 nmol/L may       indicate an independent risk factor for CHD,       but must be evaluated with caution when applied       to non-Caucasian populations due to the       influence of genetic factors on Lp(a) across       ethnicities. Performed At: [facility] [place], [place] [id number] Rush Farmer MD RR:[id number]    Sodium 12/24/2021 133 (L)  135 - 145 mmol/L Final   Potassium 12/24/2021 3.5  3.5 - 5.1 mmol/L Final   Chloride 12/24/2021 101  98 - 111 mmol/L Final   CO2 12/24/2021 22  22 - 32 mmol/L Final   Glucose, Bld 12/24/2021 111 (H)  70 - 99 mg/dL Final   Glucose reference range applies only to samples taken after fasting for at least 8 hours.   BUN 12/24/2021 7  6 - 20 mg/dL Final   Creatinine, Ser 12/24/2021 0.88  0.44 - 1.00 mg/dL Final   Calcium 12/24/2021 8.6 (L)  8.9 - 10.3 mg/dL Final   GFR, Estimated 12/24/2021 >60  >60 mL/min Final   Comment: (NOTE) Calculated using the CKD-EPI Creatinine Equation (2021)    Anion gap 12/24/2021 10  5 - 15 Final   Performed at [facility], [place] [address], [place], [place] [address]   Cholesterol 12/24/2021 133  0 - 200 mg/dL Final   Triglycerides 12/24/2021 120  <150 mg/dL Final   HDL 12/24/2021 66  >40 mg/dL Final   Total CHOL/HDL Ratio 12/24/2021 2.0  RATIO Final   VLDL 12/24/2021 24  0 - 40 mg/dL Final   LDL Cholesterol 12/24/2021 43  0 - 99 mg/dL Final   Comment:        Total Cholesterol/HDL:CHD Risk Coronary Heart Disease Risk Table                     Men   Women  1/2 Average Risk   3.4   3.3  Average Risk       5.0   4.4  2 X Average Risk   9.6   7.1  3 X Average Risk  23.4  11.0        Use the calculated Patient Ratio above and the CHD Risk Table to determine the patient's CHD  Risk.        ATP III CLASSIFICATION (LDL):  <100     mg/dL   Optimal  100-129  mg/dL   Near or Above                    Optimal  130-159  mg/dL   Borderline  160-189  mg/dL   High  >190     mg/dL   Very High Performed at [facility] [address] [place], [place] [address]    Prothrombin Time 12/24/2021 13.7  11.4 - 15.2 seconds Final   INR 12/24/2021 1.1  0.8 - 1.2 Final   Comment: (NOTE) INR goal varies based on device and disease states. Performed at [facility], [place] [address] [place], [place] [address]    Weight 12/24/2021 2,779.2  oz Final   Height 12/24/2021 66  in Final   BP 12/24/2021 109/64  mmHg Final   Single Plane A4C EF 12/24/2021 41.1  % Final   S' Lateral 12/24/2021 4.20  cm Final   AR max vel 12/24/2021 3.13  cm2 Final   AV Peak grad 12/24/2021 6.0  mmHg Final   Ao pk vel 12/24/2021 1.23  m/s Final   Area-P 1/2 12/24/2021 5.38  cm2 Final   Hgb A1c MFr Bld 12/24/2021 7.0 (H)  4.8 - 5.6 % Final   Comment: (NOTE) Pre diabetes:          5.7%-6.4%  Diabetes:              >6.4%  Glycemic control for   <7.0% adults with diabetes    Mean Plasma Glucose 12/24/2021 154.2  mg/dL Final   Performed at [facility], [place] [address] [place], [place] [address]   Troponin I (High Sensitivity) 12/24/2021 1,500 (HH)  <18 ng/L Final   Comment: CRITICAL RESULT CALLED TO, READ BACK BY AND VERIFIED WITH: Domenica Reamer, [place] 06.11.23  M. Rivet (NOTE) Elevated high sensitivity troponin I (hsTnI) values and significant  changes across serial measurements may suggest ACS but many other  chronic and acute conditions are known to elevate hsTnI results.  Refer to the Links section for chest pain algorithms and additional  guidance. Performed at [facility], [place] [address] [place], [place] [address]    Troponin I (High Sensitivity) 12/24/2021 1,083 (HH)  <18 ng/L Final   Comment: CRITICAL VALUE NOTED.  VALUE IS CONSISTENT WITH PREVIOUSLY  REPORTED AND CALLED VALUE. (NOTE) Elevated high sensitivity troponin I (hsTnI) values and significant  changes across serial measurements may suggest ACS but many other  chronic and acute conditions are known to elevate hsTnI results.  Refer to the Links section for chest pain algorithms and additional  guidance. Performed at [facility], [place] [address] [place], [place] [address]    Glucose-Capillary 12/24/2021 135 (H)  70 - 99 mg/dL Final   Glucose reference range applies only to samples taken after fasting for at least 8 hours.   Glucose-Capillary 12/24/2021 125 (H)  70 - 99 mg/dL Final   Glucose reference range applies only to samples taken after fasting for at least 8 hours.   Activated Clotting Time 12/24/2021 233  seconds Final   Reference range 74-137 seconds for patients not on anticoagulant therapy.   Glucose-Capillary 12/24/2021 104 (H)  70 - 99 mg/dL Final   Glucose reference range applies only to samples taken after fasting for at least 8 hours.   WBC 12/25/2021 6.8  4.0 - 10.5 K/uL Final   RBC 12/25/2021 3.49 (L)  3.87 - 5.11 MIL/uL Final   Hemoglobin 12/25/2021 9.4 (L)  12.0 - 15.0 g/dL Final   HCT 12/25/2021 28.3 (L)  36.0 - 46.0 % Final   MCV 12/25/2021 81.1  80.0 - 100.0 fL Final   MCH 12/25/2021 26.9  26.0 - 34.0 pg Final   MCHC 12/25/2021 33.2  30.0 - 36.0 g/dL Final   RDW 12/25/2021 15.1  11.5 - 15.5 % Final   Platelets 12/25/2021 235  150 - 400 K/uL Final   nRBC 12/25/2021 0.0  0.0 - 0.2 % Final   Performed at [facility], [place] [address] [place], [place] [address]   Glucose-Capillary 12/24/2021 142 (H)  70 - 99 mg/dL Final   Glucose reference range applies only to samples taken after fasting for at least 8 hours.   Glucose-Capillary 12/25/2021 134 (H)  70 - 99 mg/dL Final   Glucose reference range applies only to samples taken after fasting for at least 8 hours.   Glucose-Capillary 12/25/2021 174 (H)  70 - 99 mg/dL Final   Glucose reference range  applies only to samples taken after fasting for at least 8 hours.   Glucose-Capillary 12/25/2021 128 (H)  70 - 99 mg/dL Final   Glucose reference range applies only to samples taken after fasting for at least 8 hours.   WBC 12/26/2021 7.4  4.0 - 10.5 K/uL Final   RBC 12/26/2021 3.52 (L)  3.87 - 5.11 MIL/uL Final   Hemoglobin 12/26/2021 9.5 (L)  12.0 - 15.0 g/dL Final   HCT 12/26/2021 29.0 (L)  36.0 - 46.0 % Final   MCV 12/26/2021 82.4  80.0 - 100.0 fL Final   MCH 12/26/2021 27.0  26.0 - 34.0 pg Final   MCHC 12/26/2021 32.8  30.0 - 36.0 g/dL Final   RDW 12/26/2021 15.0  11.5 - 15.5 % Final   Platelets 12/26/2021 238  150 - 400 K/uL Final   nRBC 12/26/2021 0.0  0.0 - 0.2 % Final   Performed at [facility] [address], [place], [place] [address]   Glucose-Capillary 12/25/2021 174 (H)  70 - 99 mg/dL Final   Glucose reference range applies only to samples taken after fasting for at least 8 hours.   Glucose-Capillary 12/26/2021 178 (H)  70 - 99 mg/dL Final   Glucose reference range applies only to samples taken after fasting for at least 8 hours.   Glucose-Capillary 12/26/2021 102 (H)  70 - 99 mg/dL Final   Glucose reference range applies only to samples taken after fasting for at least 8 hours.    Allergies: Atorvastatin, Fluvastatin, and Simvastatin  PTA Medications: (Not in a hospital admission)   Medical Decision Making  [facility]; she will be admitted to [facility] for continuous assessment with follow up by psychiatry on day shift 03/03/2022.    Recommendations  Based on my evaluation the patient does not appear to have an emergency medical condition.  Ophelia Shoulder, NP 03/03/22  6:44 AM

## 2022-03-03 NOTE — ED Notes (Signed)
Pt asking that her Astelin nasal spray and Prilosec 40 mg be reordered.

## 2022-03-03 NOTE — ED Notes (Signed)
Pt is a high fall risk given her AMS, unsteady gait and lack of prescription eyeglasses.

## 2022-03-03 NOTE — ED Notes (Signed)
Discharge instructions provided and Pt stated understanding. Pt alert, orient and ambulatory prior to d/c from facility. Pt's spouse brought clothes for Pt to change into. No personal items needed to be returned from a locker. Pt escorted to the front lobby to d/c from facility. Safety maintained.

## 2022-03-03 NOTE — Discharge Instructions (Signed)
Take all medications as prescribed. Keep all follow-up appointments as scheduled.  Do not consume alcohol or use illegal drugs while on prescription medications. Report any adverse effects from your medications to your primary care provider promptly.  In the event of recurrent symptoms or worsening symptoms, call 911, a crisis hotline, or go to the nearest emergency department for evaluation.   

## 2022-03-03 NOTE — ED Notes (Signed)
D: Pt here voluntarily from [employer]. Pt is confused. Pt oriented to self. Pt does not know her birthdate, the year (thinks it is 2022), or where she is. When asked about SI pt responds, "Probably." Pt endorses auditory hallucinations that say the same thing this writer is saying. Pt doesn't respond about visual hallucinations. Pt denies pain at this time. Pt gives delayed responses. Pt appears to be internally preoccupied. "I need it to stop. I try to say something but I say it wrong." Pt is unsteady and needs assistance with toileting and ADLs. Pt gets defensive when you try to assist her with walking and toileting. "I need to sleep." Pt doesn't know the last time she has slept. Pt doesn't recall what medication she is taking. Pt nods in the affirmative that she is married but doesn't know anything else. Pt does not have her glasses with her and they are prescription.    A: Pt was offered support and encouragement. Pt is cooperative during assessment but poor historian. Belongings searched and contraband items placed in locker. Non-invasive skin search completed: no marks noted. Pt offered food and drink and neither accepted. Pt introduced to unit milieu by nursing staff.   R: Pt assisted to bed 2. Pt safety maintained on unit.

## 2022-03-03 NOTE — ED Notes (Signed)
Pt sleeping@this time. Breathing even and unlabored will continue to monitor for safety 

## 2022-03-03 NOTE — ED Notes (Signed)
Pt given PRN for heartburn per MAR.

## 2022-03-03 NOTE — ED Provider Notes (Signed)
FBC/OBS ASAP Discharge Summary  Date and Time: 03/03/2022 10:38 AM  Name: Jessica Bridges  MRN:  [id number]   Discharge Diagnoses:  Final diagnoses:  Generalized anxiety disorder    Subjective: Jessica Bridges is requesting to discharge, she reported " feeling a lot better"   Evaluation: Jessica Bridges was seen and evaluated face-to-face by this provider.   She is awake, alert and oriented x3.  Denying suicidal or homicidal ideations.  Denies auditory visual hallucinations.  Reports feeling better overall and is requesting a discharge.  She reports she is followed by Per[facility] counseling by Cecilio Asper for medication management and therapy services.  States she was recently discontinued from Lamictal.  However reports she has been taking her other medications as directed.  She provided verbal authorization to follow-up with her husband Jessica Bridges.  At [phone].  NP spoke to patient's husband who denied any safety concerns with patient returning home.  He reports multiple stressors related to her health deteriorating.  States she has only been getting 2 to 3 hours of sleep at night.  States her son recently moved back to [facility].  States she just started back working. "  Too much is happening all at one time."  Patient did present slightly paranoid throughout this assessment labile and tearful.  Recommend following up with psychiatry services.  Support, encouragement and reassurance was provided.   Jessica Bridges is sitting in no acute distress.She is alert/oriented x 4; calm/cooperative; and mood congruent with affect.  She is speaking in a clear tone at moderate volume, and normal pace; with good eye contact.  Her thought process is coherent and relevant; There is no indication that she is currently responding to internal/external stimuli or experiencing delusional thought content; and she has denied suicidal/self-harm/homicidal ideation, psychosis, and paranoia.   Patient has remained calm throughout  assessment and has answered questions appropriately.     At this time Jessica Bridges is educated and verbalizes understanding of mental health resources and other crisis services in the community. She is instructed to call 911 and present to the nearest emergency room should she experience any suicidal/homicidal ideation, auditory/visual/hallucinations, or detrimental worsening of her mental health condition. She was a also advised by Probation officer that she could call the toll-free phone on insurance card to assist with identifying in network counselors and agencies or number on back of Medicaid card t speak with care coordinator   Stay Summary: Per admission assessment note: "Jessica Bridges is a psychiatric history of anxiety and depression.  Patient initially [facility] concerning [facility] with c/o of altered mental status; she was evaluated and recommended for continuous assessment at [facility]. Patient was seen face to face by  this NP upon her arrival to [facility]. She is alert and oriented to self and place. She reports that she came to the hospital because she is experiencing "a mental block and I feel stuck in my mind." Patient is unable to provide further detailed about her symptoms and she reports she can not remember event from the past several days. She appears to be having difficulties processing assessment questions and she would provide inappropriate response to questions despite redirections and writer asking simple questions like "how are you feeling, can you tell me what's going on with you, how can I help you?"She denies SI, HI, hallucination, paranoia, and substance abuse."  Total Time spent with patient: 15 minutes  Past Psychiatric History:  Past Medical History:  Past Medical History:  Diagnosis Date   Diabetes mellitus without complication ([place])    Gastric bypass status for obesity    MI (myocardial infarction) ([place]) 12/26/2021   Sleep apnea    CPAP    Past  Surgical History:  Procedure Laterality Date   BREAST BIOPSY     right   CHOLECYSTECTOMY     CORONARY/GRAFT ACUTE MI REVASCULARIZATION N/A 12/24/2021   Procedure: Coronary/Graft Acute MI Revascularization;  Surgeon: [name], MD;  Location: [facility];  Service: Cardiovascular;  Laterality: N/A;   DENTAL RESTORATION/EXTRACTION WITH X-RAY  12/2016   GASTRIC BYPASS     LEFT HEART CATH AND CORONARY ANGIOGRAPHY N/A 12/24/2021   Procedure: LEFT HEART CATH AND CORONARY ANGIOGRAPHY;  Surgeon: [name], MD;  Location: [facility];  Service: Cardiovascular;  Laterality: N/A;   LIVER BIOPSY     WISDOM TOOTH EXTRACTION     Family History:  Family History  Problem Relation Age of Onset   Colon cancer Neg Hx    Family Psychiatric History:  Social History:  Social History   Substance and Sexual Activity  Alcohol Use No     Social History   Substance and Sexual Activity  Drug Use No    Social History   Socioeconomic History   Marital status: Married    Spouse name: Not on file   Number of children: Not on file   Years of education: Not on file   Highest education level: Not on file  Occupational History   Not on file  Tobacco Use   Smoking status: Never   Smokeless tobacco: Never  Substance and Sexual Activity   Alcohol use: No   Drug use: No   Sexual activity: Not on file  Other Topics Concern   Not on file  Social History Narrative   Not on file   Social Determinants of Health   Financial Resource Strain: Not on file  Food Insecurity: Not on file  Transportation Needs: Not on file  Physical Activity: Not on file  Stress: Not on file  Social Connections: Not on file   SDOH:  SDOH Screenings   Alcohol Screen: Not on file  Depression (PHQ2-9): Not on file  Financial Resource Strain: Not on file  Food Insecurity: Not on file  Housing: Not on file  Physical Activity: Not on file  Social Connections: Not on file  Stress: Not on file   Tobacco Use: Low Risk  (03/02/2022)   Patient History    Smoking Tobacco Use: Never    Smokeless Tobacco Use: Never    Passive Exposure: Not on file  Transportation Needs: Not on file    Tobacco Cessation:  N/A, patient does not currently use tobacco products  Current Medications:  Current Facility-Administered Medications  Medication Dose Route Frequency Provider Last Rate Last Admin   acetaminophen (TYLENOL) tablet 650 mg  650 mg Oral Q6H PRN Ajibola, Ene A, NP   650 mg at 03/03/22 1011   alum & mag hydroxide-simeth (MAALOX/MYLANTA) 200-200-20 MG/5ML suspension 30 mL  30 mL Oral Q4H PRN Ajibola, Ene A, NP   30 mL at 03/03/22 0604   busPIRone (BUSPAR) tablet 10 mg  10 mg Oral TID Ajibola, Ene A, NP   10 mg at 03/03/22 0937   lamoTRIgine (LAMICTAL) tablet 200 mg  200 mg Oral BID Ajibola, Ene A, NP   200 mg at 03/03/22 0937   magnesium hydroxide (MILK OF MAGNESIA) suspension 30 mL  30 mL Oral Daily PRN  Ajibola, Ene A, NP       metFORMIN (GLUCOPHAGE-XR) 24 hr tablet 1,000 mg  1,000 mg Oral BID WC Ajibola, Ene A, NP   1,000 mg at 03/03/22 0822   montelukast (SINGULAIR) tablet 10 mg  10 mg Oral QHS Ajibola, Ene A, NP       pantoprazole (PROTONIX) EC tablet 40 mg  40 mg Oral Daily Ajibola, Ene A, NP   40 mg at 03/03/22 7510   Current Outpatient Medications  Medication Sig Dispense Refill   acetaminophen (TYLENOL) 500 MG tablet Take 1,000 mg by mouth every 6 (six) hours as needed for moderate pain.     aspirin EC 81 MG tablet Take 1 tablet (81 mg total) by mouth daily. Swallow whole. 90 tablet 3   bismuth subsalicylate (PEPTO BISMOL) 262 MG/15ML suspension Take 30 mLs by mouth every 4 (four) hours as needed for diarrhea or loose stools or indigestion.     Iron TABS Take 1 tablet by mouth daily.     lamoTRIgine (LAMICTAL) 200 MG tablet Take 200 mg by mouth 2 (two) times daily.     ondansetron (ZOFRAN) 8 MG tablet Take 8 mg by mouth as needed for nausea or vomiting.     OVER THE COUNTER  MEDICATION Apply 1 Application topically daily as needed (pain). Medication OTC: Real time cream     OVER THE COUNTER MEDICATION Take 1 tablet by mouth daily. Medication: Mitomax     APLENZIN 522 MG TB24 Take 522 mg by mouth daily.     azelastine (ASTELIN) 0.1 % nasal spray Place 1 spray into both nostrils 2 (two) times daily.     busPIRone (BUSPAR) 10 MG tablet Take 10 mg by mouth 3 (three) times daily. (Patient not taking: Reported on 03/03/2022)     Calcium 250 MG CAPS Take 250 mg by mouth in the morning and at bedtime.     Cholecalciferol (VITAMIN D PO) Take 5,000 Units by mouth daily. Patient uses 5000 iu.      Insulin Glargine (BASAGLAR KWIKPEN [place]) Inject 10-20 Units into the skin in the morning. Per sliding scale (Patient not taking: Reported on 03/03/2022)     levocetirizine (XYZAL) 5 MG tablet Take 5 mg by mouth every evening. (Patient not taking: Reported on 03/03/2022)     metFORMIN (GLUCOPHAGE-XR) 500 MG 24 hr tablet Take 1,000 mg by mouth 2 (two) times daily.     Misc Natural Products (JOINT SUPPORT PO) Take 1-2 tablets by mouth daily.     montelukast (SINGULAIR) 10 MG tablet Take 10 mg by mouth at bedtime.     Multiple Vitamin (MULTIVITAMIN) tablet Take 1 tablet by mouth daily.     omeprazole (PRILOSEC) 40 MG capsule Take 40 mg by mouth daily.      rosuvastatin (CRESTOR) 20 MG tablet Take 1 tablet (20 mg total) by mouth daily. 30 tablet 11   tirzepatide (MOUNJARO) 2.5 MG/0.5ML Pen Inject 2.5 mg into the skin once a week.     vortioxetine HBr (TRINTELLIX) 10 MG TABS Take 10 mg by mouth in the morning and at bedtime.      PTA Medications: (Not in a hospital admission)       No data to display          [facility] from 03/03/2022 in [facility] from 03/02/2022 in [facility] ED from 01/05/2022 in [facility] No Risk No Risk  No Risk       Musculoskeletal   Strength & Muscle Tone: within normal limits Gait & Station: normal Patient leans: N/A  Psychiatric Specialty Exam  Presentation  General Appearance: Appropriate for Environment  Eye Contact:Good  Speech:Slow  Speech Volume:Normal  Handedness:Right   Mood and Affect  Mood:Anxious  Affect:Congruent   Thought Process  Thought Processes: Descriptions of Associations:Loose  Orientation:Partial  Thought Content:WDL  Diagnosis of Schizophrenia or Schizoaffective disorder in past: No    Hallucinations:Hallucinations: None  Ideas of Reference:None  Suicidal Thoughts:Suicidal Thoughts: No  Homicidal Thoughts:Homicidal Thoughts: No   Sensorium  Memory:Immediate Poor; Recent Poor; Remote Poor  Judgment:Impaired  Insight:Lacking   Executive Functions  Concentration:Fair  Attention Span:Fair  Recall:Poor  Fund of Knowledge:Poor  Language:Poor   Psychomotor Activity  Psychomotor Activity:Psychomotor Activity: Normal   Assets  Assets:Desire for Improvement; Social Support   Sleep  Sleep:Sleep: Poor (4 hours per day)   Nutritional Assessment (For OBS and FBC admissions only) Has the patient had a weight loss or gain of 10 pounds or more in the last 3 months?: No Has the patient had a decrease in food intake/or appetite?: No Does the patient have dental problems?: No Does the patient have eating habits or behaviors that may be indicators of an eating disorder including binging or inducing vomiting?: No Has the patient recently lost weight without trying?: 0 Has the patient been eating poorly because of a decreased appetite?: 0 Malnutrition Screening Tool Score: 0    Physical Exam  Physical Exam Vitals and nursing note reviewed.  Constitutional:      Appearance: Normal appearance.  HENT:     Head: Normocephalic.  Cardiovascular:     Rate and Rhythm: Normal rate and regular rhythm.  Neurological:     Mental Status: She is alert and oriented  to person, place, and time.  Psychiatric:        Mood and Affect: Mood normal.        Thought Content: Thought content normal.    Review of Systems  Respiratory: Negative.    Cardiovascular: Negative.   Gastrointestinal: Negative.   Psychiatric/Behavioral:  Negative for depression and suicidal ideas. The patient is nervous/anxious.   All other systems reviewed and are negative.  Blood pressure 127/77, pulse 94, temperature 98.3 F (36.8 C), temperature source Oral, resp. rate 20, SpO2 99 %. There is no height or weight on file to calculate BMI.  Demographic Factors:  Caucasian  Loss Factors: Reported excessive stress  Historical Factors: NA  Risk Reduction Factors:   Positive social support, Positive therapeutic relationship, and Positive coping skills or problem solving skills  Continued Clinical Symptoms:  Depression:   Severe  Cognitive Features That Contribute To Risk:  Closed-mindedness    Suicide Risk:  Minimal: No identifiable suicidal ideation.  Patients presenting with no risk factors but with morbid ruminations; may be classified as minimal risk based on the severity of the depressive symptoms  Plan Of Care/Follow-up recommendations:  Activity:  as tolerated   Disposition: Take all medications as prescribed. Keep all follow-up appointments as scheduled.  Do not consume alcohol or use illegal drugs while on prescription medications. Report any adverse effects from your medications to your primary care provider promptly.  In the event of recurrent symptoms or worsening symptoms, call 911, a crisis hotline, or go to the nearest emergency department for evaluation.    Derrill Center, NP 03/03/2022, 10:38 AM

## 2022-03-06 LAB — VITAMIN B1: Vitamin B1 (Thiamine): 93.8 nmol/L (ref 66.5–200.0)

## 2022-04-20 ENCOUNTER — Ambulatory Visit: Admitting: Cardiovascular Disease

## 2022-05-01 ENCOUNTER — Telehealth: Payer: Self-pay | Admitting: Cardiovascular Disease

## 2022-05-01 NOTE — Telephone Encounter (Signed)
Patient is calling requesting a provider switch from Dr. Gwenlyn Found to Dr. Harrell Gave after hearing her speak last night. Please advise.

## 2022-05-02 NOTE — Telephone Encounter (Signed)
Ok by me

## 2022-05-04 NOTE — Telephone Encounter (Signed)
LVM informing pt of provider switch approval so upcoming appt can be rescheduled.

## 2022-05-15 ENCOUNTER — Ambulatory Visit: Admitting: Cardiovascular Disease

## 2022-05-18 ENCOUNTER — Encounter (HOSPITAL_BASED_OUTPATIENT_CLINIC_OR_DEPARTMENT_OTHER): Payer: Self-pay | Admitting: Cardiology

## 2022-05-18 ENCOUNTER — Ambulatory Visit (INDEPENDENT_AMBULATORY_CARE_PROVIDER_SITE_OTHER): Admitting: Cardiology

## 2022-05-18 VITALS — BP 122/70 | HR 83 | Ht 66.0 in | Wt 149.1 lb

## 2022-05-18 DIAGNOSIS — I5181 Takotsubo syndrome: Secondary | ICD-10-CM | POA: Diagnosis not present

## 2022-05-18 DIAGNOSIS — R251 Tremor, unspecified: Secondary | ICD-10-CM

## 2022-05-18 DIAGNOSIS — Z7189 Other specified counseling: Secondary | ICD-10-CM

## 2022-05-18 DIAGNOSIS — I428 Other cardiomyopathies: Secondary | ICD-10-CM | POA: Diagnosis not present

## 2022-05-18 DIAGNOSIS — E119 Type 2 diabetes mellitus without complications: Secondary | ICD-10-CM | POA: Diagnosis not present

## 2022-05-18 MED ORDER — PROPRANOLOL HCL 10 MG PO TABS: 10.0000 mg | ORAL_TABLET | Freq: Two times a day (BID) | ORAL | 11 refills | Status: DC

## 2022-05-18 NOTE — Progress Notes (Signed)
Cardiology Office Note:    Date:  05/18/2022   ID:  Jessica Bridges, DOB 1965/10/04, MRN [id number]  PCP:  Bridget Hartshorn, NP  Cardiologist:  Buford Dresser, MD  Referring MD: Bridget Hartshorn, NP   CC: general cardiology follow up/transfer care to me  History of Present Illness:    Jessica Bridges is a 56 y.o. female with a hx of NSTEMI, Takotsubo syndrome, DM, GERD, s/p gastric bypass, anxiety, and depression who is seen for follow up. She has been followed by Dr. Gwenlyn Found but requested to transfer care to me for a second opinion.  12/23/21 she presented with NSTEMI.  She had a widely patent left main and patent LAD with mild nonobstructive mid vessel plaque, angiographically normal left circumflex and normal LVEDP (cardiac cath 12/24/2021). Diagnosed with Takotsubo syndrome.  She was seen in the ER on 01/05/22 for chest pain.  EKG was performed with no ischemic changes.  Chest pain started about an hour and a half ago prior to ED visit.  She had a history significant for Takotsubo cardiomyopathy.  At that time she was started on a statin and a beta-blocker.  She did have some mild edema in her legs.  Troponin negative x2.  Chest x-ray without any evidence of pneumonia or pneumothorax.  D-dimer is normal as well as BNP.  It was suspected that her chest pain was due to stress/anxiety she was feeling better after Ativan.  Patient was last seen by cardiology PA Nicholes Rough for follow up on 02/06/22. She reported some dizziness and nausea daily at that time.  Today, she states that she is doing okay overall. We reviewed her echo and cath results from June 2023. We discussed the indications for a beta blocker following her NSTEMI. She states that she took Metoprolol for 2-3 weeks before stopping it due to worsened chronic fatigue. She wonders if she should try Propanolol given that she has tremors and anxiety.   She has tried Trulicity with negative GI side effects. She is  currently on Mounjaro. She reports excellent BGL control and weight reduction on the Mounjaro. She has not tried [place] or [place].  The patient denies chest pain, chest pressure, dyspnea at rest or with exertion, PND, orthopnea, or leg swelling. Denies cough, fever, chills, nausea, or vomiting. Denies syncope. Denies any hematuria or blood in her stools. Denies dizziness or lightheadedness.    She returns to work on 06/04/22.   She states that she has spinal stenosis and is considering bilateral L4-5 decompression surgery with Dr. Nelva Bush. She recently had a nerve root block which did not provide any pain relief.   Past Medical History:  Diagnosis Date   Diabetes mellitus without complication ([place])    Gastric bypass status for obesity    MI (myocardial infarction) ([place]) 12/26/2021   Sleep apnea    CPAP    Past Surgical History:  Procedure Laterality Date   BREAST BIOPSY     right   CHOLECYSTECTOMY     CORONARY/GRAFT ACUTE MI REVASCULARIZATION N/A 12/24/2021   Procedure: Coronary/Graft Acute MI Revascularization;  Surgeon: [name], MD;  Location: [facility];  Service: Cardiovascular;  Laterality: N/A;   DENTAL RESTORATION/EXTRACTION WITH X-RAY  12/2016   GASTRIC BYPASS     LEFT HEART CATH AND CORONARY ANGIOGRAPHY N/A 12/24/2021   Procedure: LEFT HEART CATH AND CORONARY ANGIOGRAPHY;  Surgeon: [name], MD;  Location: [facility];  Service: Cardiovascular;  Laterality: N/A;  LIVER BIOPSY     WISDOM TOOTH EXTRACTION      Current Medications: Current Outpatient Medications on File Prior to Visit  Medication Sig   acetaminophen (TYLENOL) 500 MG tablet Take 1,000 mg by mouth every 6 (six) hours as needed for moderate pain.   APLENZIN 522 MG TB24 Take 522 mg by mouth daily.   aspirin EC 81 MG tablet Take 1 tablet (81 mg total) by mouth daily. Swallow whole.   azelastine (ASTELIN) 0.1 % nasal spray Place 1 spray into both nostrils 2 (two) times daily.    bismuth subsalicylate (PEPTO BISMOL) 262 MG/15ML suspension Take 30 mLs by mouth every 4 (four) hours as needed for diarrhea or loose stools or indigestion.   busPIRone (BUSPAR) 10 MG tablet Take 10 mg by mouth 3 (three) times daily.   Calcium 250 MG CAPS Take 250 mg by mouth in the morning and at bedtime.   Cholecalciferol (VITAMIN D PO) Take 5,000 Units by mouth daily. Patient uses 5000 iu.    diclofenac Sodium (VOLTAREN) 1 % GEL Apply 2 g topically as needed.   Iron TABS Take 1 tablet by mouth daily.   lamoTRIgine (LAMICTAL) 200 MG tablet Take 200 mg by mouth 2 (two) times daily.   levocetirizine (XYZAL) 5 MG tablet Take 5 mg by mouth every evening.   lidocaine (LIDODERM) 5 % APPLY 1 PATCH TO SKIN ONCE DAILY (APPLY FOR 12 HOURS, THEN REMOVE FOR 12 HOURS)   metFORMIN (GLUCOPHAGE) 1000 MG tablet Take 1 tablet by mouth 2 (two) times daily.   Misc Natural Products (JOINT SUPPORT PO) Take 1-2 tablets by mouth daily.   montelukast (SINGULAIR) 10 MG tablet Take 10 mg by mouth at bedtime.   Multiple Vitamin (MULTIVITAMIN) tablet Take 1 tablet by mouth daily.   omeprazole (PRILOSEC) 40 MG capsule Take 40 mg by mouth daily.    ondansetron (ZOFRAN) 8 MG tablet Take 8 mg by mouth as needed for nausea or vomiting.   OVER THE COUNTER MEDICATION Apply 1 Application topically daily as needed (pain). Medication OTC: Real time cream   OVER THE COUNTER MEDICATION Take 1 tablet by mouth daily. Medication: Mitomax   QUEtiapine (SEROQUEL) 50 MG tablet Take 50-100 mg by mouth at bedtime.   rosuvastatin (CRESTOR) 20 MG tablet Take 1 tablet (20 mg total) by mouth daily.   tirzepatide [facility]) 2.5 MG/0.5ML Pen Inject 2.5 mg into the skin once a week.   vortioxetine HBr (TRINTELLIX) 10 MG TABS Take 10 mg by mouth in the morning and at bedtime.   No current facility-administered medications on file prior to visit.     Allergies:   Atorvastatin, Fluvastatin, and Simvastatin   Social History   Tobacco Use    Smoking status: Never   Smokeless tobacco: Never  Substance Use Topics   Alcohol use: No   Drug use: No    Family History: family history is negative for Colon cancer.  ROS:   Please see the history of present illness.  Additional pertinent ROS: Constitutional: Negative for chills, fever, night sweats, unintentional weight loss. Positive for fatigue. HENT: Negative for ear pain and hearing loss.   Eyes: Negative for loss of vision and eye pain.  Respiratory: Negative for cough, sputum, wheezing.   Cardiovascular: See HPI. Gastrointestinal: Negative for abdominal pain, melena, and hematochezia.  Genitourinary: Negative for dysuria and hematuria.  Musculoskeletal: Negative for falls and myalgias.  Skin: Negative for itching and rash.  Neurological: Negative for focal weakness, focal sensory changes  and loss of consciousness.  Endo/Heme/Allergies: Does not bruise/bleed easily.     EKGs/Labs/Other Studies Reviewed:    The following studies were reviewed today:  Cath 12/24/21: 1. Widely patent left main 2. Patent LAD with mild nonobstructive mid vessel plaquing 3. Angiographically normal left circumflex 4. Normal LVEDP Recommendations: The patient has mild nonobstructive coronary artery disease. I suspect she is having a non-coronary event such as atypical stress-induced event like acute Takotsubo syndrome. I reviewed her echo and her wall motion abnormality is focal involving the mid-septum. It does not appear to follow any typical coronary distribution. Recommend medical therapy.   Echo 12/24/21: 1. Akinesis of the midseptum with overall mild LV dysfunction. Left ventricular ejection fraction, by estimation, is 45 to 50%. The left ventricle has mildly decreased function. The left ventricle demonstrates regional wall motion abnormalities (see scoring diagram/findings for description). Left ventricular diastolic parameters are consistent with Grade I diastolic dysfunction (impaired  relaxation). 2.Right ventricular systolic function is normal. The right ventricular size is normal. Tricuspid regurgitation signal is inadequate for assessing PA pressure. 3.The mitral valve is normal in structure. Trivial mitral valve regurgitation. No evidence of mitral stenosis. 4.The aortic valve is tricuspid. Aortic valve regurgitation is not visualized. No aortic stenosis is present. 5.The inferior vena cava is normal in size with greater than 50% respiratory variability, suggesting right atrial pressure of 3 mmHg.  EKG:  EKG was ordered today. EKG done today was personally reviewed and demonstrates NSR at 83 bpm  Recent Labs: 01/05/2022: B Natriuretic Peptide 75.1 03/02/2022: ALT 26; BUN 9; Creatinine, Ser 0.82; Hemoglobin 11.0; Platelets 300; Potassium 4.7; Sodium 132  Recent Lipid Panel    Component Value Date/Time   CHOL 117 02/21/2022 1617   TRIG 78 02/21/2022 1617   HDL 78 02/21/2022 1617   CHOLHDL 1.5 02/21/2022 1617   CHOLHDL 2.0 12/24/2021 0537   VLDL 24 12/24/2021 0537   LDLCALC 23 02/21/2022 1617    Physical Exam:    VS:  BP 122/70 (BP Location: Left Arm, Patient Position: Sitting, Cuff Size: Normal)   Pulse 83   Ht '5\' 6"'$  (1.676 m)   Wt 149 lb 1.6 oz (67.6 kg)   BMI 24.07 kg/m     Wt Readings from Last 3 Encounters:  05/18/22 149 lb 1.6 oz (67.6 kg)  03/02/22 161 lb (73 kg)  02/06/22 170 lb (77.1 kg)    GEN: Well nourished, well developed in no acute distress HEENT: Normal, moist mucous membranes NECK: No JVD CARDIAC: regular rhythm, normal S1 and S2, no rubs or gallops. No murmur. VASCULAR: Radial and DP pulses 2+ bilaterally. No carotid bruits RESPIRATORY:  Clear to auscultation without rales, wheezing or rhonchi  ABDOMEN: Soft, non-tender, non-distended MUSCULOSKELETAL:  Ambulates independently SKIN: Warm and dry, no edema NEUROLOGIC:  Alert and oriented x 3. No focal neuro deficits noted. PSYCHIATRIC:  Normal affect    ASSESSMENT:    1.  Stress-induced cardiomyopathy   2. NICM (nonischemic cardiomyopathy) ([place])   3. Tremor   4. Type 2 diabetes mellitus without complication, without long-term current use of insulin (HCC)   5. Cardiac risk counseling   6. Counseling on health promotion and disease prevention    PLAN:    Nonischemic cardiomyopathy, likely stress induced History of NSTEMI without CAD -did not tolerate metoprolol due to fatigue -she is asking about propranolol, as she has a history of tremor and anxiety. We discussed that propranolol is not one of the preferred beta blockers for cardiomyopathy. However, she  did not tolerate metoprolol and likely does not have enough blood pressure room for carvedilol.  -after shared decision making, ok to trial propranolol, will contact me with any issues -tolerating aspirin, rosuvastatin, would continue given diabetes -discussed options re: repeating echo  Type II diabetes -with NICM, discussed GLP1RA and SGLT2i. Currently on tirzepatide (GLP1/GIP RA) -has not been on SGLT2i that I can see  Cardiac risk counseling and prevention recommendations: -recommend heart healthy/Mediterranean diet, with whole grains, fruits, vegetable, fish, lean meats, nuts, and olive oil. Limit salt. -recommend moderate walking, 3-5 times/week for 30-50 minutes each session. Aim for at least 150 minutes.week. Goal should be pace of 3 miles/hours, or walking 1.5 miles in 30 minutes -recommend avoidance of tobacco products. Avoid excess alcohol.  Plan for follow up: 6-8 weeks or sooner if needed.  Buford Dresser, MD, PhD, [facility]    Medication Adjustments/Labs and Tests Ordered: Current medicines are reviewed at length with the patient today.  Concerns regarding medicines are outlined above.  Orders Placed This Encounter  Procedures   EKG 12-Lead   Meds ordered this encounter  Medications   propranolol (INDERAL) 10 MG tablet    Sig: Take 1 tablet (10 mg  total) by mouth 2 (two) times daily.    Dispense:  60 tablet    Refill:  11    Patient Instructions  Medication Instructions:  Propanolol 10 mg twice daily  *If you need a refill on your cardiac medications before your next appointment, please call your pharmacy*   Lab Work: None  Testing/Procedures: None   Follow-Up: At [facility], you and your health needs are our priority.  As part of our continuing mission to provide you with exceptional heart care, we have created designated Provider Care Teams.  These Care Teams include your primary Cardiologist (physician) and Advanced Practice Providers (APPs -  Physician Assistants and Nurse Practitioners) who all work together to provide you with the care you need, when you need it.  We recommend signing up for the patient portal called "MyChart".  Sign up information is provided on this After Visit Summary.  MyChart is used to connect with patients for Virtual Visits (Telemedicine).  Patients are able to view lab/test results, encounter notes, upcoming appointments, etc.  Non-urgent messages can be sent to your provider as well.   To learn more about what you can do with MyChart, go to [email or web address].    Your next appointment:   6 week(s) to 8 weeks  The format for your next appointment:   In Person  Provider:   Buford Dresser, MD    Other Instructions Call if you need to reschedule the appointment.           I,Alexis Herring,acting as a Education administrator for [employer], MD.,have documented all relevant documentation on the behalf of Buford Dresser, MD,as directed by  Buford Dresser, MD while in the presence of Buford Dresser, MD.  I, Buford Dresser, MD, have reviewed all documentation for this visit. The documentation on 06/18/22 for the exam, diagnosis, procedures, and orders are all accurate and complete.   Signed, Buford Dresser, MD PhD 05/18/2022     [place]

## 2022-05-18 NOTE — Patient Instructions (Signed)
Medication Instructions:  Propanolol 10 mg twice daily  *If you need a refill on your cardiac medications before your next appointment, please call your pharmacy*   Lab Work: None  Testing/Procedures: None   Follow-Up: At [facility], you and your health needs are our priority.  As part of our continuing mission to provide you with exceptional heart care, we have created designated Provider Care Teams.  These Care Teams include your primary Cardiologist (physician) and Advanced Practice Providers (APPs -  Physician Assistants and Nurse Practitioners) who all work together to provide you with the care you need, when you need it.  We recommend signing up for the patient portal called "MyChart".  Sign up information is provided on this After Visit Summary.  MyChart is used to connect with patients for Virtual Visits (Telemedicine).  Patients are able to view lab/test results, encounter notes, upcoming appointments, etc.  Non-urgent messages can be sent to your provider as well.   To learn more about what you can do with MyChart, go to [email or web address].    Your next appointment:   6 week(s) to 8 weeks  The format for your next appointment:   In Person  Provider:   Buford Dresser, MD    Other Instructions Call if you need to reschedule the appointment.

## 2022-05-21 ENCOUNTER — Encounter (HOSPITAL_BASED_OUTPATIENT_CLINIC_OR_DEPARTMENT_OTHER): Payer: Self-pay

## 2022-05-21 DIAGNOSIS — I5181 Takotsubo syndrome: Secondary | ICD-10-CM

## 2022-05-21 MED ORDER — EMPAGLIFLOZIN 10 MG PO TABS: 10.0000 mg | ORAL_TABLET | Freq: Every day | ORAL | 5 refills | Status: DC

## 2022-05-21 NOTE — Telephone Encounter (Signed)
Per clinic visit with Dr. Harrell Gave they discussed starting Jardiance. As patient interested, Rx sent. BMP in 2 weeks ordered and lab slips mailed to patient. Per review on CoverMyMeds - prior authorization for Jardiance not required.   Jessica Dubonnet, NP

## 2022-05-21 NOTE — Telephone Encounter (Signed)
Please advise, okay to start Jardiance?

## 2022-06-18 ENCOUNTER — Encounter (HOSPITAL_BASED_OUTPATIENT_CLINIC_OR_DEPARTMENT_OTHER): Payer: Self-pay | Admitting: Cardiology

## 2022-06-18 ENCOUNTER — Encounter (HOSPITAL_BASED_OUTPATIENT_CLINIC_OR_DEPARTMENT_OTHER): Payer: Self-pay

## 2022-06-21 ENCOUNTER — Telehealth (HOSPITAL_BASED_OUTPATIENT_CLINIC_OR_DEPARTMENT_OTHER): Payer: Self-pay

## 2022-06-21 NOTE — Telephone Encounter (Signed)
   Name: Jessica Bridges  DOB: Jul 03, 1966  MRN: [id number]  Primary Cardiologist: Buford Dresser, MD  Chart reviewed as part of pre-operative protocol coverage. The patient has an upcoming visit scheduled with Dr. Harrell Gave on 06/26/2022 at which time clearance can be addressed in case there are any issues that would impact surgical recommendations.  Lumbar decompression is not scheduled until TBD as below. I added preop FYI to appointment note so that provider is aware to address at time of outpatient visit.  Per office protocol the cardiology provider should forward their finalized clearance decision and recommendations regarding antiplatelet therapy to the requesting party below.    I will route this message as FYI to requesting party and remove this message from the preop box as separate preop APP input not needed at this time.   Please call with any questions.  [name], NP  06/21/2022, 3:57 PM

## 2022-06-21 NOTE — Telephone Encounter (Signed)
   Pre-operative Risk Assessment    Patient Name: Jessica Bridges  DOB: [birth year] MRN: [id number]      Request for Surgical Clearance    Procedure:   Lumbar Decompression  Date of Surgery:  Clearance TBD                                 Surgeon:  Dr. Susa Day Surgeon's Group or Practice Name:  [facility] Phone number:  [phone] Fax number:  [phone]   Type of Clearance Requested:   - Medical  Need to Hold ASA 81?   Type of Anesthesia:   Not specified   Additional requests/questions:   None  Signed, Francella Solian   06/21/2022, 3:48 PM

## 2022-06-25 NOTE — Progress Notes (Signed)
Cardiology Office Note:    Date:  06/27/2022   ID:  Jessica Bridges, DOB [birth year], MRN [id number]  PCP:  Rebecka Apley, NP  Cardiologist:  Jodelle Red, MD  Referring MD: Rebecka Apley, NP   CC: general cardiology follow up, pre-op clearance  History of Present Illness:    Jessica Bridges is a 56 y.o. female with a hx of NSTEMI, Takotsubo syndrome, DM, GERD, s/p gastric bypass, anxiety, and depression who is seen for follow up. She has been followed by Dr. Allyson Sabal but requested to transfer care to me for a second opinion.  12/23/21 she presented with NSTEMI.  She had a widely patent left main and patent LAD with mild nonobstructive mid vessel plaque, angiographically normal left circumflex and normal LVEDP (cardiac cath 12/24/2021). Diagnosed with Takotsubo syndrome.  She was seen in the ER on 01/05/22 for chest pain.  EKG was performed with no ischemic changes.  Chest pain started about an hour and a half ago prior to ED visit.  She had a history significant for Takotsubo cardiomyopathy.  At that time she was started on a statin and a beta-blocker.  She did have some mild edema in her legs.  Troponin negative x2.  Chest x-ray without any evidence of pneumonia or pneumothorax.  D-dimer is normal as well as BNP.  It was suspected that her chest pain was due to stress/anxiety; she was feeling better after Ativan.  She took Metoprolol for 2-3 weeks before stopping it due to worsened chronic fatigue. She wondered if she should try Propanolol given that she had tremors and anxiety. After shared decision making, we planned to trial propranolol. She tried Trulicity with negative GI side effects. She was on [facility] and reported excellent BGL control and weight reduction. She had not tried [place] or [place]. She was given a prescription for 10 mg Jardiance.  Today, she also presents for preoperative clearance for lumbar decompression (date TBD) to be performed by Dr.  Shelle Iron, with request to hold 81 mg aspirin. Her numbness and weakness of her feet and toes are worsening. She hopes to have this completed by the end of the year. She denies any prior complications with anesthesia. No recent chest pain, shortness of breath, syncope, unexpected weight gain, LE edema, or palpitations. If she has to, she would be able to climb a flight of stairs. Her most strenuous activity is walking her dogs. She also works with a physical therapist.  Diagnosed with anxiety, had psychotic break in August and was told she was bipolar last week. Has had panic attacks. Has been told her memory is affected.  Pending back surgery for foot numbness.  Has not started medication given all of these high stress features. Has a lot of concerns about medications. Discussed propranolol and Jardiance at length today. Discussed guideline recommended medical therapy at length today. Now that psych conditions have been identified, discussed that propranolol was more of a compromise for anxiety, now would recommend a beta blocker with cardiomyopathy indication. Discussed ACEi/ARB/ARNI as well, discussed MRA. After shared decision making, will recheck echo. Declines beta blocker for now, will think about Jardiance.  Planned surgery: lumbar decompression, date TBD, Dr. Shelle Iron ([facility])  History of anesthesia complications: none Current symptoms: Denies chest pain, shortness of breath at rest or with normal exertion. No PND, orthopnea, LE edema or unexpected weight gain. No syncope or palpitations.  Functional capacity: can climb stairs if needed. Walks dogs routinely without limitation, working with  physical therapist.  She denies any palpitations, chest pain, shortness of breath, or peripheral edema. No lightheadedness, headaches, syncope, orthopnea, or PND.   Past Medical History:  Diagnosis Date   Diabetes mellitus without complication (HCC)    Gastric bypass status for obesity     MI (myocardial infarction) (HCC) 12/26/2021   Sleep apnea    CPAP    Past Surgical History:  Procedure Laterality Date   BREAST BIOPSY     right   CHOLECYSTECTOMY     CORONARY/GRAFT ACUTE MI REVASCULARIZATION N/A 12/24/2021   Procedure: Coronary/Graft Acute MI Revascularization;  Surgeon: Tonny Bollman, MD;  Location: [facility] INVASIVE CV LAB;  Service: Cardiovascular;  Laterality: N/A;   DENTAL RESTORATION/EXTRACTION WITH X-RAY  12/2016   GASTRIC BYPASS     LEFT HEART CATH AND CORONARY ANGIOGRAPHY N/A 12/24/2021   Procedure: LEFT HEART CATH AND CORONARY ANGIOGRAPHY;  Surgeon: Tonny Bollman, MD;  Location: [facility] INVASIVE CV LAB;  Service: Cardiovascular;  Laterality: N/A;   LIVER BIOPSY     WISDOM TOOTH EXTRACTION      Current Medications: Current Outpatient Medications on File Prior to Visit  Medication Sig   acetaminophen (TYLENOL) 500 MG tablet Take 1,000 mg by mouth every 6 (six) hours as needed for moderate pain.   APLENZIN 522 MG TB24 Take 522 mg by mouth daily.   aspirin EC 81 MG tablet Take 1 tablet (81 mg total) by mouth daily. Swallow whole.   azelastine (ASTELIN) 0.1 % nasal spray Place 1 spray into both nostrils 2 (two) times daily.   bismuth subsalicylate (PEPTO BISMOL) 262 MG/15ML suspension Take 30 mLs by mouth every 4 (four) hours as needed for diarrhea or loose stools or indigestion.   busPIRone (BUSPAR) 10 MG tablet Take 10 mg by mouth 3 (three) times daily.   Calcium 250 MG CAPS Take 250 mg by mouth in the morning and at bedtime.   Cholecalciferol (VITAMIN D PO) Take 5,000 Units by mouth daily. Patient uses 5000 iu.    diclofenac Sodium (VOLTAREN) 1 % GEL Apply 2 g topically as needed.   Iron TABS Take 1 tablet by mouth daily.   lamoTRIgine (LAMICTAL) 100 MG tablet Take 100 mg by mouth 2 (two) times daily.   levocetirizine (XYZAL) 5 MG tablet Take 5 mg by mouth every evening.   lidocaine (LIDODERM) 5 % APPLY 1 PATCH TO SKIN ONCE DAILY (APPLY FOR 12 HOURS, THEN REMOVE  FOR 12 HOURS)   lithium carbonate 150 MG capsule Take 150 mg by mouth daily.   metFORMIN (GLUCOPHAGE) 1000 MG tablet Take 1 tablet by mouth 2 (two) times daily.   Misc Natural Products (JOINT SUPPORT PO) Take 1-2 tablets by mouth daily.   montelukast (SINGULAIR) 10 MG tablet Take 10 mg by mouth at bedtime.   Multiple Vitamin (MULTIVITAMIN) tablet Take 1 tablet by mouth daily.   omeprazole (PRILOSEC) 40 MG capsule Take 40 mg by mouth daily.    ondansetron (ZOFRAN) 8 MG tablet Take 8 mg by mouth as needed for nausea or vomiting.   OVER THE COUNTER MEDICATION Apply 1 Application topically daily as needed (pain). Medication OTC: Real time cream   OVER THE COUNTER MEDICATION Take 1 tablet by mouth daily. Medication: Mitomax   QUEtiapine (SEROQUEL) 50 MG tablet Take 50-100 mg by mouth at bedtime.   rosuvastatin (CRESTOR) 20 MG tablet Take 1 tablet (20 mg total) by mouth daily.   tirzepatide [facility]) 2.5 MG/0.5ML Pen Inject 2.5 mg into the skin once a week.  vortioxetine HBr (TRINTELLIX) 10 MG TABS Take 10 mg by mouth in the morning and at bedtime.   empagliflozin (JARDIANCE) 10 MG TABS tablet Take 1 tablet (10 mg total) by mouth daily before breakfast. (Patient not taking: Reported on 06/26/2022)   No current facility-administered medications on file prior to visit.     Allergies:   Atorvastatin, Fluvastatin, and Simvastatin   Social History   Tobacco Use   Smoking status: Never   Smokeless tobacco: Never  Substance Use Topics   Alcohol use: No   Drug use: No    Family History: family history is negative for Colon cancer.  ROS:   Please see the history of present illness. (+) Stress, Anxiety (+) Numbness of feet, toes All other systems are reviewed and negative.   EKGs/Labs/Other Studies Reviewed:    The following studies were reviewed today:  Cath 12/24/21: 1. Widely patent left main 2. Patent LAD with mild nonobstructive mid vessel plaquing 3. Angiographically normal left  circumflex 4. Normal LVEDP Recommendations: The patient has mild nonobstructive coronary artery disease. I suspect she is having a non-coronary event such as atypical stress-induced event like acute Takotsubo syndrome. I reviewed her echo and her wall motion abnormality is focal involving the mid-septum. It does not appear to follow any typical coronary distribution. Recommend medical therapy.   Echo 12/24/21: 1. Akinesis of the midseptum with overall mild LV dysfunction. Left ventricular ejection fraction, by estimation, is 45 to 50%. The left ventricle has mildly decreased function. The left ventricle demonstrates regional wall motion abnormalities (see scoring diagram/findings for description). Left ventricular diastolic parameters are consistent with Grade I diastolic dysfunction (impaired relaxation). 2.Right ventricular systolic function is normal. The right ventricular size is normal. Tricuspid regurgitation signal is inadequate for assessing PA pressure. 3.The mitral valve is normal in structure. Trivial mitral valve regurgitation. No evidence of mitral stenosis. 4.The aortic valve is tricuspid. Aortic valve regurgitation is not visualized. No aortic stenosis is present. 5.The inferior vena cava is normal in size with greater than 50% respiratory variability, suggesting right atrial pressure of 3 mmHg.  EKG:  EKG was personally reviewed.  06/26/2022:  NSR at 88 bpm 05/18/2022:  NSR at 83 bpm  Recent Labs: 01/05/2022: B Natriuretic Peptide 75.1 03/02/2022: ALT 26; BUN 9; Creatinine, Ser 0.82; Hemoglobin 11.0; Platelets 300; Potassium 4.7; Sodium 132   Recent Lipid Panel    Component Value Date/Time   CHOL 117 02/21/2022 1617   TRIG 78 02/21/2022 1617   HDL 78 02/21/2022 1617   CHOLHDL 1.5 02/21/2022 1617   CHOLHDL 2.0 12/24/2021 0537   VLDL 24 12/24/2021 0537   LDLCALC 23 02/21/2022 1617    Physical Exam:    VS:  BP 134/80 (BP Location: Left Arm, Patient Position: Sitting,  Cuff Size: Normal)   Pulse 88   Ht 5\' 6"  (1.676 m)   Wt 146 lb 3.2 oz (66.3 kg)   BMI 23.60 kg/m     Wt Readings from Last 3 Encounters:  06/26/22 146 lb 3.2 oz (66.3 kg)  05/18/22 149 lb 1.6 oz (67.6 kg)  03/02/22 161 lb (73 kg)    GEN: Well nourished, well developed in no acute distress HEENT: Normal, moist mucous membranes NECK: No JVD CARDIAC: regular rhythm, normal S1 and S2, no rubs or gallops. No murmur. VASCULAR: Radial and DP pulses 2+ bilaterally. No carotid bruits RESPIRATORY:  Clear to auscultation without rales, wheezing or rhonchi  ABDOMEN: Soft, non-tender, non-distended MUSCULOSKELETAL:  Ambulates independently SKIN: Warm and  dry, no edema NEUROLOGIC:  Alert and oriented x 3. No focal neuro deficits noted. PSYCHIATRIC:  Normal affect    ASSESSMENT:    1. Preop cardiovascular exam   2. Stress-induced cardiomyopathy   3. NICM (nonischemic cardiomyopathy) (HCC)   4. Type 2 diabetes mellitus without complication, without long-term current use of insulin (HCC)     PLAN:    Preoperative cardiovascular evaluation According to the Revised Cardiac Risk Index (RCRI), her RCRI risk score is 1.  The patient is not currently having active cardiac symptoms, and they can achieve >4 METs of activity.  According to ACC/AHA Guidelines, no further testing is needed.  Proceed with surgery at acceptable risk.  Our service is available as needed in the peri-operative period.     Nonischemic cardiomyopathy, likely stress induced History of NSTEMI without CAD -did not tolerate metoprolol due to fatigue -Previously discussed propranolol, as she has a history of tremor and anxiety. We discussed that propranolol is not one of the preferred beta blockers for cardiomyopathy. However, she did not tolerate metoprolol and likely does not have enough blood pressure room for carvedilol. She elected not to trial propranolol, and given this, would consider another beta blocker (?bisoprolol)  in the future -reviewed recommendations for ACEi/ARB/ARNI and SGLT2i today -tolerating aspirin, rosuvastatin, would continue given diabetes -discussed options re: repeating echo. After shared decision making, she would like to recheck echo before changing any medications  Type II diabetes -with NICM, discussed GLP1RA and SGLT2i. Currently on tirzepatide (GLP1/GIP RA) -has not been on SGLT2i that I can see. We discussed at length today. She will discuss this with her PCP  Cardiac risk counseling and prevention recommendations: -recommend heart healthy/Mediterranean diet, with whole grains, fruits, vegetable, fish, lean meats, nuts, and olive oil. Limit salt. -recommend moderate walking, 3-5 times/week for 30-50 minutes each session. Aim for at least 150 minutes.week. Goal should be pace of 3 miles/hours, or walking 1.5 miles in 30 minutes -recommend avoidance of tobacco products. Avoid excess alcohol.  Plan for follow up: 3 months or sooner if needed.  Jodelle Red, MD, PhD, [facility]  [facility]    Medication Adjustments/Labs and Tests Ordered: Current medicines are reviewed at length with the patient today.  Concerns regarding medicines are outlined above.   Orders Placed This Encounter  Procedures   EKG 12-Lead   ECHOCARDIOGRAM COMPLETE   No orders of the defined types were placed in this encounter.  Patient Instructions  Medication Instructions:  Continue current medications  *If you need a refill on your cardiac medications before your next appointment, please call your pharmacy*   Lab Work: None Ordered   Testing/Procedures: Your physician has requested that you have an echocardiogram. Echocardiography is a painless test that uses sound waves to create images of your heart. It provides your doctor with information about the size and shape of your heart and how well your heart's chambers and valves are working. This procedure takes approximately one  hour. There are no restrictions for this procedure. Please do NOT wear cologne, perfume, aftershave, or lotions (deodorant is allowed). Please arrive 15 minutes prior to your appointment time.   Follow-Up: At [facility], you and your health needs are our priority.  As part of our continuing mission to provide you with exceptional heart care, we have created designated Provider Care Teams.  These Care Teams include your primary Cardiologist (physician) and Advanced Practice Providers (APPs -  Physician Assistants and Nurse Practitioners) who all work  together to provide you with the care you need, when you need it.  We recommend signing up for the patient portal called "MyChart".  Sign up information is provided on this After Visit Summary.  MyChart is used to connect with patients for Virtual Visits (Telemedicine).  Patients are able to view lab/test results, encounter notes, upcoming appointments, etc.  Non-urgent messages can be sent to your provider as well.   To learn more about what you can do with MyChart, go to [email or web address].    Your next appointment:   3 month(s)  The format for your next appointment:   In Person  Provider:   Jodelle Red, MD    Other Instructions          I,Mathew Stumpf,acting as a scribe for Jodelle Red, MD.,have documented all relevant documentation on the behalf of Jodelle Red, MD,as directed by  Jodelle Red, MD while in the presence of Jodelle Red, MD.  I, Jodelle Red, MD, have reviewed all documentation for this visit. The documentation on 06/27/22 for the exam, diagnosis, procedures, and orders are all accurate and complete.   Signed, Jodelle Red, MD PhD 06/27/2022     [facility]

## 2022-06-26 ENCOUNTER — Ambulatory Visit (INDEPENDENT_AMBULATORY_CARE_PROVIDER_SITE_OTHER): Admitting: Cardiology

## 2022-06-26 ENCOUNTER — Encounter (HOSPITAL_BASED_OUTPATIENT_CLINIC_OR_DEPARTMENT_OTHER): Payer: Self-pay | Admitting: Cardiology

## 2022-06-26 VITALS — BP 134/80 | HR 88 | Ht 66.0 in | Wt 146.2 lb

## 2022-06-26 DIAGNOSIS — I428 Other cardiomyopathies: Secondary | ICD-10-CM | POA: Diagnosis not present

## 2022-06-26 DIAGNOSIS — I5181 Takotsubo syndrome: Secondary | ICD-10-CM

## 2022-06-26 DIAGNOSIS — E119 Type 2 diabetes mellitus without complications: Secondary | ICD-10-CM

## 2022-06-26 DIAGNOSIS — Z01810 Encounter for preprocedural cardiovascular examination: Secondary | ICD-10-CM | POA: Diagnosis not present

## 2022-06-26 NOTE — Progress Notes (Unsigned)
Diagnosed with anxiety, had psychotic break in August and was told she was bipolar last week. Has been told her memory is affected.  Pending back surgery for foot numbness.  Has not started medication given all of these high stress features. Has a lot of concerns about medications. Discussed propranolol and Jardiance at length today. Discussed guideline recommended medical therapy at length today. Now that psych conditions have been identified, discussed that propranolol was more of a compromise for anxiety, now would recommend a beta blocker with cardiomyopathy indication. Discussed ACEi/ARB/ARNI as well, discussed MRA. After shared decision making, will recheck echo. Declines beta blocker for now, will think about Jardiance.  Planned surgery: lumbar decompression, date TBD, Dr. Tonita Cong ([facility])  History of anesthesia complications: none Current symptoms: Denies chest pain, shortness of breath at rest or with normal exertion. No PND, orthopnea, LE edema or unexpected weight gain. No syncope or palpitations.  Functional capacity: can climb stairs if needed. Walks dogs routinely without limitation, working with physical therapist.

## 2022-06-26 NOTE — Patient Instructions (Signed)
Medication Instructions:  Continue current medications  *If you need a refill on your cardiac medications before your next appointment, please call your pharmacy*   Lab Work: None Ordered   Testing/Procedures: Your physician has requested that you have an echocardiogram. Echocardiography is a painless test that uses sound waves to create images of your heart. It provides your doctor with information about the size and shape of your heart and how well your heart's chambers and valves are working. This procedure takes approximately one hour. There are no restrictions for this procedure. Please do NOT wear cologne, perfume, aftershave, or lotions (deodorant is allowed). Please arrive 15 minutes prior to your appointment time.   Follow-Up: At [facility], you and your health needs are our priority.  As part of our continuing mission to provide you with exceptional heart care, we have created designated Provider Care Teams.  These Care Teams include your primary Cardiologist (physician) and Advanced Practice Providers (APPs -  Physician Assistants and Nurse Practitioners) who all work together to provide you with the care you need, when you need it.  We recommend signing up for the patient portal called "MyChart".  Sign up information is provided on this After Visit Summary.  MyChart is used to connect with patients for Virtual Visits (Telemedicine).  Patients are able to view lab/test results, encounter notes, upcoming appointments, etc.  Non-urgent messages can be sent to your provider as well.   To learn more about what you can do with MyChart, go to [email or web address].    Your next appointment:   3 month(s)  The format for your next appointment:   In Person  Provider:   Buford Dresser, MD    Other Instructions

## 2022-06-27 ENCOUNTER — Encounter (HOSPITAL_BASED_OUTPATIENT_CLINIC_OR_DEPARTMENT_OTHER): Payer: Self-pay | Admitting: Cardiology

## 2022-06-29 ENCOUNTER — Ambulatory Visit: Payer: Self-pay | Admitting: Orthopedic Surgery

## 2022-06-29 DIAGNOSIS — M48062 Spinal stenosis, lumbar region with neurogenic claudication: Secondary | ICD-10-CM

## 2022-07-02 ENCOUNTER — Ambulatory Visit: Payer: Self-pay | Admitting: Orthopedic Surgery

## 2022-07-02 NOTE — H&P (View-Only) (Signed)
Jessica Bridges is an 56 y.o. female.   Chief Complaint: back and leg pain HPI: Reason for Visit: (normal) visit for: (back) Location (Lower Extremity): lower back pain ; left is worse Severity: pain level 4/10 Quality: aching Aggravating Factors: standing for ; sitting for Associated Symptoms: numbness/tingling (RLE) Medications: helping a little (Lidocaine patches); Tylenol and Lidocaine patches Notes: 05/10/22 R L5 SNRB No relief Very little relief from her injection. Pain radiates down her right leg with numbness associated with that. Worse with standing better with sitting she does get some symptoms off to her left side  Past Medical History:  Diagnosis Date   Diabetes mellitus without complication ([place])    Gastric bypass status for obesity    MI (myocardial infarction) ([place]) 12/26/2021   Sleep apnea    CPAP    Past Surgical History:  Procedure Laterality Date   BREAST BIOPSY     right   CHOLECYSTECTOMY     CORONARY/GRAFT ACUTE MI REVASCULARIZATION N/A 12/24/2021   Procedure: Coronary/Graft Acute MI Revascularization;  Surgeon: [name], MD;  Location: [facility];  Service: Cardiovascular;  Laterality: N/A;   DENTAL RESTORATION/EXTRACTION WITH X-RAY  12/2016   GASTRIC BYPASS     LEFT HEART CATH AND CORONARY ANGIOGRAPHY N/A 12/24/2021   Procedure: LEFT HEART CATH AND CORONARY ANGIOGRAPHY;  Surgeon: [name], MD;  Location: [facility];  Service: Cardiovascular;  Laterality: N/A;   LIVER BIOPSY     WISDOM TOOTH EXTRACTION      Family History  Problem Relation Age of Onset   Colon cancer Neg Hx    Social History:  reports that she has never smoked. She has never used smokeless tobacco. She reports that she does not drink alcohol and does not use drugs.  Allergies:  Allergies  Allergen Reactions   Atorvastatin Other (See Comments)    Flu symptoms   Fluvastatin Other (See Comments)    Flu symptoms   Simvastatin Other (See Comments)    Flu  symptoms   Current meds: Aplenzin 522 mg tablet,extended release azelastine 137 mcg (0.1 %) nasal spray aerosol busPIRone 10 mg tablet Jardiance 10 mg tablet lamoTRIgine 200 mg tablet levocetirizine 5 mg tablet metFORMIN ER 500 mg tablet,extended release 24 hr montelukast 10 mg tablet Mounjaro 2.5 mg/0.5 mL subcutaneous pen injector omeprazole 40 mg capsule,delayed release ondansetron HCL 8 mg tablet propranoloL 10 mg tablet QUEtiapine 50 mg tablet rosuvastatin 10 mg tablet Trintellix 10 mg tablet  Review of Systems  Constitutional: Negative.   HENT: Negative.    Eyes: Negative.   Respiratory: Negative.    Cardiovascular: Negative.   Gastrointestinal: Negative.   Endocrine: Negative.   Genitourinary: Negative.   Musculoskeletal:  Positive for back pain and gait problem.  Neurological:  Positive for weakness and numbness.    There were no vitals taken for this visit. Physical Exam Constitutional:      Appearance: Normal appearance.  HENT:     Head: Normocephalic.     Right Ear: External ear normal.     Left Ear: External ear normal.     Nose: Nose normal.     Mouth/Throat:     Pharynx: Oropharynx is clear.  Eyes:     Conjunctiva/sclera: Conjunctivae normal.  Cardiovascular:     Rate and Rhythm: Normal rate and regular rhythm.     Pulses: Normal pulses.  Pulmonary:     Effort: Pulmonary effort is normal.  Abdominal:     General: Bowel sounds are  normal.  Musculoskeletal:     Cervical back: Normal range of motion.     Comments: Gait and Station: Appearance: ambulating with no assistive devices and antalgic gait.  Constitutional: General Appearance: healthy-appearing and distress (mild).  Psychiatric: Mood and Affect: active and alert.  Cardiovascular System: Edema Right: none; Dorsalis and posterior tibial pulses 2+. Edema Left: none.  Abdomen: Inspection and Palpation: non-distended and no tenderness.  Skin: Inspection and palpation: no rash.  Lumbar  Spine: Inspection: normal alignment. Bony Palpation of the Lumbar Spine: tender at lumbosacral junction.. Bony Palpation of the Right Hip: no tenderness of the greater trochanter and tenderness of the SI joint; Pelvis stable. Bony Palpation of the Left Hip: no tenderness of the greater trochanter and tenderness of the SI joint. Soft Tissue Palpation on the Right: No flank pain with percussion. Active Range of Motion: limited flexion and extention.  Motor Strength: L1 Motor Strength on the Right: hip flexion iliopsoas 5/5. L1 Motor Strength on the Left: hip flexion iliopsoas 5/5. L2-L4 Motor Strength on the Right: knee extension quadriceps 5/5. L2-L4 Motor Strength on the Left: knee extension quadriceps 5/5. L5 Motor Strength on the Right: ankle dorsiflexion tibialis anterior 5/5 and great toe extension extensor hallucis longus 5/5. L5 Motor Strength on the Left: ankle dorsiflexion tibialis anterior 5/5 and great toe extension extensor hallucis longus 5/5. S1 Motor Strength on the Right: plantar flexion gastrocnemius 5/5. S1 Motor Strength on the Left: plantar flexion gastrocnemius 5/5.  Neurological System: Knee Reflex Right: normal (2). Knee Reflex Left: normal (2). Ankle Reflex Right: normal (2). Ankle Reflex Left: normal (2). Babinski Reflex Right: plantar reflex absent. Babinski Reflex Left: plantar reflex absent. Sensation on the Right: normal distal extremities. Sensation on the Left: normal distal extremities. Special Tests on the Right: no clonus of the ankle/knee and seated straight leg raising test positive. Special Tests on the Left: no clonus of the ankle/knee and seated straight leg raising test positive.  EHL is 3/5 on the right 4/5 in the left.  Neurological:     Mental Status: She is alert.    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 We mutually agreed to forego a fusion as her pain is minimal and she is not interested in instrumented fusion.  I had an extensive discussion with the patient concerning the pathology relevant anatomy and treatment options. At this point exhausting conservative treatment and in the presence of a neurologic deficit we discussed microlumbar decompression. I discussed the risks and benefits including bleeding, infection, DVT, PE, anesthetic complications, worsening in their symptoms, improvement in their symptoms, C SF leakage, epidural fibrosis, need for future surgeries such as revision discectomy and lumbar fusion. I also indicated that this is an operation to basically decompress the nerve roots to allow recovery as opposed to fixing a herniated disc if it is encountered and that the incidence of recurrent chest disc herniation can approach 15%. Also that nerve root recovery is variable and may  not recover completely. Any ligament or bone that is contributing to compressing the nerves will be removed as well.  I discussed the operative course including overnight in the hospital. Immediate ambulation. Follow-up in 2 weeks for suture removal. 6 weeks until healing of the herniation and surgical incision followed by 6 weeks of reconditioning and strengthening of the core musculature. Also discussed the need to employ the concepts of disc pressure management and core motion following the surgery to minimize the risk of recurrent disc herniation. We will obtain  preoperative clearance i if necessary and proceed accordingly.  She reported heart attack a while back she is on aspirin. She had a catheterization which indicated no blockages we will have to get a cardiac clearance for that. No history of MRSA or DVT  Plan Bilateral Hemilaminotomy microdiscectomy L4-5  Cecilie Kicks, PA-C for Dr Tonita Cong 07/02/2022, 12:08 PM

## 2022-07-02 NOTE — Pre-Procedure Instructions (Signed)
Surgical Instructions    Your procedure is scheduled on Thursday, July 05, 2022.  Report to [facility] Main Entrance "A" at 8:30 A.M., then check in with the Admitting office.  Call this number if you have problems the morning of surgery:  [phone]   If you have any questions prior to your surgery date call [phone]: Open Monday-Friday 8am-4pm If you experience any cold or flu symptoms such as cough, fever, chills, shortness of breath, etc. between now and your scheduled surgery, please notify us at the above number     Remember:  Do not eat after midnight the night before your surgery  You may drink clear liquids until 7:30 am the morning of your surgery.   Clear liquids allowed are: Water, Non-Citrus Juices (without pulp), Carbonated Beverages, Clear Tea, Black Coffee ONLY (NO MILK, CREAM OR POWDERED CREAMER of any kind), and Gatorade    Enhanced Recovery after Surgery  Enhanced Recovery after Surgery is a protocol used to improve the stress on your body and your recovery after surgery.  Patient Instructions  The day of surgery (if you have diabetes):  Drink ONE small 12 oz bottle of Gatorade G2 by 7:30 am the morning of surgery This bottle was given to you during your hospital  pre-op appointment visit.  Nothing else to drink after completing the  Small 12 oz bottle of Gatorade G2.         If you have questions, please contact your surgeon's office.    Take these medicines the morning of surgery with A SIP OF WATER:  omeprazole (PRILOSEC)  lithium carbonate  lamoTRIgine (LAMICTAL)  busPIRone (BUSPAR)  azelastine (ASTELIN) 0.1 % nasal spray  APLENZIN   IF needed: acetaminophen (TYLENOL)   ondansetron (ZOFRAN)   WHAT DO I DO ABOUT MY DIABETES MEDICATION?  **Hold empagliflozin (JARDIANCE) 3 days prior to your surgery  **Hold tirzepatide [facility]) 7 days prior to your surgery  Do not take oral diabetes medicines metFORMIN (GLUCOPHAGE) (pills) the  morning of surgery.  HOW TO MANAGE YOUR DIABETES BEFORE AND AFTER SURGERY  Why is it important to control my blood sugar before and after surgery? Improving blood sugar levels before and after surgery helps healing and can limit problems. A way of improving blood sugar control is eating a healthy diet by:  Eating less sugar and carbohydrates  Increasing activity/exercise  Talking with your doctor about reaching your blood sugar goals High blood sugars (greater than 180 mg/dL) can raise your risk of infections and slow your recovery, so you will need to focus on controlling your diabetes during the weeks before surgery. Make sure that the doctor who takes care of your diabetes knows about your planned surgery including the date and location.  How do I manage my blood sugar before surgery? Check your blood sugar at least 4 times a day, starting 2 days before surgery, to make sure that the level is not too high or low.  Check your blood sugar the morning of your surgery when you wake up and every 2 hours until you get to the Short Stay unit.  If your blood sugar is less than 70 mg/dL, you will need to treat for low blood sugar: Do not take insulin. Treat a low blood sugar (less than 70 mg/dL) with  cup of clear juice (cranberry or apple), 4 glucose tablets, OR glucose gel. Recheck blood sugar in 15 minutes after treatment (to make sure it is greater than 70 mg/dL). If your blood  sugar is not greater than 70 mg/dL on recheck, call [phone] for further instructions. Report your blood sugar to the short stay nurse when you get to Short Stay.  If you are admitted to the hospital after surgery: Your blood sugar will be checked by the staff and you will probably be given insulin after surgery (instead of oral diabetes medicines) to make sure you have good blood sugar levels. The goal for blood sugar control after surgery is 80-180 mg/dL.  As of today, STOP taking any Aspirin (unless  otherwise instructed by your surgeon) Aleve, Naproxen, Ibuprofen, Motrin, Advil, Goody's, BC's, all herbal medications, fish oil, and all vitamins.  Follow your surgeon's instructions on when to stop Aspirin.  If no instructions were given by your surgeon then you will need to call the office to get those instructions.         Do not wear jewelry or makeup. Do not wear lotions, powders, perfumes or deodorant. Do not shave 48 hours prior to surgery.   Do not bring valuables to the hospital. Do not wear nail polish, gel polish, artificial nails, or any other type of covering on natural nails (fingers and toes) If you have artificial nails or gel coating that need to be removed by a nail salon, please have this removed prior to surgery. Artificial nails or gel coating may interfere with anesthesia's ability to adequately monitor your vital signs.  [facility] is not responsible for any belongings or valuables.    Do NOT Smoke (Tobacco/Vaping)  24 hours prior to your procedure  If you use a CPAP at night, you may bring your mask for your overnight stay.   Contacts, glasses, hearing aids, dentures or partials may not be worn into surgery, please bring cases for these belongings   For patients admitted to the hospital, discharge time will be determined by your treatment team.   Patients discharged the day of surgery will not be allowed to drive home, and someone needs to stay with them for 24 hours.   SURGICAL WAITING ROOM VISITATION Patients having surgery or a procedure may have no more than 2 support people in the waiting area - these visitors may rotate.   Children under the age of 101 must have an adult with them who is not the patient. If the patient needs to stay at the hospital during part of their recovery, the visitor guidelines for inpatient rooms apply. Pre-op nurse will coordinate an appropriate time for 1 support person to accompany patient in pre-op.  This support person may not  rotate.   Please refer to [email or web address] for the visitor guidelines for Inpatients (after your surgery is over and you are in a regular room).    Special instructions:    Oral Hygiene is also important to reduce your risk of infection.  Remember - BRUSH YOUR TEETH THE MORNING OF SURGERY WITH YOUR REGULAR TOOTHPASTE   [facility]- Preparing For Surgery  Before surgery, you can play an important role. Because skin is not sterile, your skin needs to be as free of germs as possible. You can reduce the number of germs on your skin by washing with CHG (chlorahexidine gluconate) Soap before surgery.  CHG is an antiseptic cleaner which kills germs and bonds with the skin to continue killing germs even after washing.     Please do not use if you have an allergy to CHG or antibacterial soaps. If your skin becomes reddened/irritated stop using the CHG.  Do not  shave (including legs and underarms) for at least 48 hours prior to first CHG shower. It is OK to shave your face.  Please follow these instructions carefully.     Shower the NIGHT BEFORE SURGERY and the MORNING OF SURGERY with CHG Soap.   If you chose to wash your hair, wash your hair first as usual with your normal shampoo. After you shampoo, rinse your hair and body thoroughly to remove the shampoo.  Then [employer] and genitals (private parts) with your normal soap and rinse thoroughly to remove soap.  After that Use CHG Soap as you would any other liquid soap. You can apply CHG directly to the skin and wash gently with a scrungie or a clean washcloth.   Apply the CHG Soap to your body ONLY FROM THE NECK DOWN.  Do not use on open wounds or open sores. Avoid contact with your eyes, ears, mouth and genitals (private parts). Wash Face and genitals (private parts)  with your normal soap.   Wash thoroughly, paying special attention to the area where your surgery will be  performed.  Thoroughly rinse your body with warm water from the neck down.  DO NOT shower/wash with your normal soap after using and rinsing off the CHG Soap.  Pat yourself dry with a CLEAN TOWEL.  Wear CLEAN PAJAMAS to bed the night before surgery  Place CLEAN SHEETS on your bed the night before your surgery  DO NOT SLEEP WITH PETS.   Day of Surgery:  Take a shower with CHG soap. Wear Clean/Comfortable clothing the morning of surgery Do not apply any deodorants/lotions.   Remember to brush your teeth WITH YOUR REGULAR TOOTHPASTE.    If you received a COVID test during your pre-op visit, it is requested that you wear a mask when out in public, stay away from anyone that may not be feeling well, and notify your surgeon if you develop symptoms. If you have been in contact with anyone that has tested positive in the last 10 days, please notify your surgeon.    Please read over the following fact sheets that you were given.

## 2022-07-02 NOTE — H&P (Signed)
Jessica Bridges is an 56 y.o. female.   Chief Complaint: back and leg pain HPI: Reason for Visit: (normal) visit for: (back) Location (Lower Extremity): lower back pain ; left is worse Severity: pain level 4/10 Quality: aching Aggravating Factors: standing for ; sitting for Associated Symptoms: numbness/tingling (RLE) Medications: helping a little (Lidocaine patches); Tylenol and Lidocaine patches Notes: 05/10/22 R L5 SNRB No relief Very little relief from her injection. Pain radiates down her right leg with numbness associated with that. Worse with standing better with sitting she does get some symptoms off to her left side  Past Medical History:  Diagnosis Date   Diabetes mellitus without complication ([place])    Gastric bypass status for obesity    MI (myocardial infarction) ([place]) 12/26/2021   Sleep apnea    CPAP    Past Surgical History:  Procedure Laterality Date   BREAST BIOPSY     right   CHOLECYSTECTOMY     CORONARY/GRAFT ACUTE MI REVASCULARIZATION N/A 12/24/2021   Procedure: Coronary/Graft Acute MI Revascularization;  Surgeon: [name], MD;  Location: [facility];  Service: Cardiovascular;  Laterality: N/A;   DENTAL RESTORATION/EXTRACTION WITH X-RAY  12/2016   GASTRIC BYPASS     LEFT HEART CATH AND CORONARY ANGIOGRAPHY N/A 12/24/2021   Procedure: LEFT HEART CATH AND CORONARY ANGIOGRAPHY;  Surgeon: [name], MD;  Location: [facility];  Service: Cardiovascular;  Laterality: N/A;   LIVER BIOPSY     WISDOM TOOTH EXTRACTION      Family History  Problem Relation Age of Onset   Colon cancer Neg Hx    Social History:  reports that she has never smoked. She has never used smokeless tobacco. She reports that she does not drink alcohol and does not use drugs.  Allergies:  Allergies  Allergen Reactions   Atorvastatin Other (See Comments)    Flu symptoms   Fluvastatin Other (See Comments)    Flu symptoms   Simvastatin Other (See Comments)    Flu  symptoms   Current meds: Aplenzin 522 mg tablet,extended release azelastine 137 mcg (0.1 %) nasal spray aerosol busPIRone 10 mg tablet Jardiance 10 mg tablet lamoTRIgine 200 mg tablet levocetirizine 5 mg tablet metFORMIN ER 500 mg tablet,extended release 24 hr montelukast 10 mg tablet Mounjaro 2.5 mg/0.5 mL subcutaneous pen injector omeprazole 40 mg capsule,delayed release ondansetron HCL 8 mg tablet propranoloL 10 mg tablet QUEtiapine 50 mg tablet rosuvastatin 10 mg tablet Trintellix 10 mg tablet  Review of Systems  Constitutional: Negative.   HENT: Negative.    Eyes: Negative.   Respiratory: Negative.    Cardiovascular: Negative.   Gastrointestinal: Negative.   Endocrine: Negative.   Genitourinary: Negative.   Musculoskeletal:  Positive for back pain and gait problem.  Neurological:  Positive for weakness and numbness.    There were no vitals taken for this visit. Physical Exam Constitutional:      Appearance: Normal appearance.  HENT:     Head: Normocephalic.     Right Ear: External ear normal.     Left Ear: External ear normal.     Nose: Nose normal.     Mouth/Throat:     Pharynx: Oropharynx is clear.  Eyes:     Conjunctiva/sclera: Conjunctivae normal.  Cardiovascular:     Rate and Rhythm: Normal rate and regular rhythm.     Pulses: Normal pulses.  Pulmonary:     Effort: Pulmonary effort is normal.  Abdominal:     General: Bowel sounds are  normal.  Musculoskeletal:     Cervical back: Normal range of motion.     Comments: Gait and Station: Appearance: ambulating with no assistive devices and antalgic gait.  Constitutional: General Appearance: healthy-appearing and distress (mild).  Psychiatric: Mood and Affect: active and alert.  Cardiovascular System: Edema Right: none; Dorsalis and posterior tibial pulses 2+. Edema Left: none.  Abdomen: Inspection and Palpation: non-distended and no tenderness.  Skin: Inspection and palpation: no rash.  Lumbar  Spine: Inspection: normal alignment. Bony Palpation of the Lumbar Spine: tender at lumbosacral junction.. Bony Palpation of the Right Hip: no tenderness of the greater trochanter and tenderness of the SI joint; Pelvis stable. Bony Palpation of the Left Hip: no tenderness of the greater trochanter and tenderness of the SI joint. Soft Tissue Palpation on the Right: No flank pain with percussion. Active Range of Motion: limited flexion and extention.  Motor Strength: L1 Motor Strength on the Right: hip flexion iliopsoas 5/5. L1 Motor Strength on the Left: hip flexion iliopsoas 5/5. L2-L4 Motor Strength on the Right: knee extension quadriceps 5/5. L2-L4 Motor Strength on the Left: knee extension quadriceps 5/5. L5 Motor Strength on the Right: ankle dorsiflexion tibialis anterior 5/5 and great toe extension extensor hallucis longus 5/5. L5 Motor Strength on the Left: ankle dorsiflexion tibialis anterior 5/5 and great toe extension extensor hallucis longus 5/5. S1 Motor Strength on the Right: plantar flexion gastrocnemius 5/5. S1 Motor Strength on the Left: plantar flexion gastrocnemius 5/5.  Neurological System: Knee Reflex Right: normal (2). Knee Reflex Left: normal (2). Ankle Reflex Right: normal (2). Ankle Reflex Left: normal (2). Babinski Reflex Right: plantar reflex absent. Babinski Reflex Left: plantar reflex absent. Sensation on the Right: normal distal extremities. Sensation on the Left: normal distal extremities. Special Tests on the Right: no clonus of the ankle/knee and seated straight leg raising test positive. Special Tests on the Left: no clonus of the ankle/knee and seated straight leg raising test positive.  EHL is 3/5 on the right 4/5 in the left.  Neurological:     Mental Status: She is alert.    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 We mutually agreed to forego a fusion as her pain is minimal and she is not interested in instrumented fusion.  I had an extensive discussion with the patient concerning the pathology relevant anatomy and treatment options. At this point exhausting conservative treatment and in the presence of a neurologic deficit we discussed microlumbar decompression. I discussed the risks and benefits including bleeding, infection, DVT, PE, anesthetic complications, worsening in their symptoms, improvement in their symptoms, C SF leakage, epidural fibrosis, need for future surgeries such as revision discectomy and lumbar fusion. I also indicated that this is an operation to basically decompress the nerve roots to allow recovery as opposed to fixing a herniated disc if it is encountered and that the incidence of recurrent chest disc herniation can approach 15%. Also that nerve root recovery is variable and may  not recover completely. Any ligament or bone that is contributing to compressing the nerves will be removed as well.  I discussed the operative course including overnight in the hospital. Immediate ambulation. Follow-up in 2 weeks for suture removal. 6 weeks until healing of the herniation and surgical incision followed by 6 weeks of reconditioning and strengthening of the core musculature. Also discussed the need to employ the concepts of disc pressure management and core motion following the surgery to minimize the risk of recurrent disc herniation. We will obtain  preoperative clearance i if necessary and proceed accordingly.  She reported heart attack a while back she is on aspirin. She had a catheterization which indicated no blockages we will have to get a cardiac clearance for that. No history of MRSA or DVT  Plan Bilateral Hemilaminotomy microdiscectomy L4-5  Cecilie Kicks, PA-C for Dr Tonita Cong 07/02/2022, 12:08 PM

## 2022-07-03 ENCOUNTER — Encounter (HOSPITAL_COMMUNITY)
Admission: RE | Admit: 2022-07-03 | Discharge: 2022-07-03 | Disposition: A | Discharge status: Home / Self Care | Source: Ambulatory Visit | Attending: Specialist | Admitting: Specialist

## 2022-07-03 ENCOUNTER — Encounter (HOSPITAL_COMMUNITY): Payer: Self-pay

## 2022-07-03 ENCOUNTER — Other Ambulatory Visit: Payer: Self-pay

## 2022-07-03 ENCOUNTER — Ambulatory Visit (HOSPITAL_COMMUNITY)
Admission: RE | Admit: 2022-07-03 | Discharge: 2022-07-03 | Disposition: A | Discharge status: Home / Self Care | Source: Ambulatory Visit | Attending: Orthopedic Surgery | Admitting: Orthopedic Surgery

## 2022-07-03 VITALS — BP 118/68 | HR 85 | Temp 97.5°F | Resp 18 | Ht 66.0 in | Wt 142.9 lb

## 2022-07-03 DIAGNOSIS — M48062 Spinal stenosis, lumbar region with neurogenic claudication: Secondary | ICD-10-CM

## 2022-07-03 DIAGNOSIS — Z01818 Encounter for other preprocedural examination: Secondary | ICD-10-CM | POA: Insufficient documentation

## 2022-07-03 HISTORY — DX: Personal history of urinary calculi: Z87.442

## 2022-07-03 HISTORY — DX: Gastro-esophageal reflux disease without esophagitis: K21.9

## 2022-07-03 HISTORY — DX: Anemia, unspecified: D64.9

## 2022-07-03 HISTORY — DX: Depression, unspecified: F32.A

## 2022-07-03 LAB — CBC
HCT: 35.8 % — ABNORMAL LOW (ref 36.0–46.0)
Hemoglobin: 12.1 g/dL (ref 12.0–15.0)
MCH: 30.1 pg (ref 26.0–34.0)
MCHC: 33.8 g/dL (ref 30.0–36.0)
MCV: 89.1 fL (ref 80.0–100.0)
Platelets: 273 10*3/uL (ref 150–400)
RBC: 4.02 MIL/uL (ref 3.87–5.11)
RDW: 13.2 % (ref 11.5–15.5)
WBC: 10.1 10*3/uL (ref 4.0–10.5)
nRBC: 0 % (ref 0.0–0.2)

## 2022-07-03 LAB — BASIC METABOLIC PANEL
Anion gap: 11 (ref 5–15)
BUN: 13 mg/dL (ref 6–20)
CO2: 24 mmol/L (ref 22–32)
Calcium: 9.5 mg/dL (ref 8.9–10.3)
Chloride: 100 mmol/L (ref 98–111)
Creatinine, Ser: 0.95 mg/dL (ref 0.44–1.00)
GFR, Estimated: >60 mL/min (ref >60)
Glucose, Bld: 127 mg/dL — ABNORMAL HIGH (ref 70–99)
Potassium: 3.9 mmol/L (ref 3.5–5.1)
Sodium: 135 mmol/L (ref 135–145)

## 2022-07-03 LAB — GLUCOSE, CAPILLARY: Glucose-Capillary: 128 mg/dL — ABNORMAL HIGH (ref 70–99)

## 2022-07-03 LAB — SURGICAL PCR SCREEN
MRSA, PCR: NEGATIVE
Staphylococcus aureus: NEGATIVE

## 2022-07-03 NOTE — Pre-Procedure Instructions (Signed)
Surgical Instructions    Your procedure is scheduled on Thursday, July 05, 2022.  Report to [facility] Main Entrance "A" at 8:30 A.M., then check in with the Admitting office.  Call this number if you have problems the morning of surgery:  [phone]   If you have any questions prior to your surgery date call [phone]: Open Monday-Friday 8am-4pm If you experience any cold or flu symptoms such as cough, fever, chills, shortness of breath, etc. between now and your scheduled surgery, please notify us at the above number     Remember:  Do not eat after midnight the night before your surgery  You may drink clear liquids until 7:30 am the morning of your surgery.   Clear liquids allowed are: Water, Non-Citrus Juices (without pulp), Carbonated Beverages, Clear Tea, Black Coffee ONLY (NO MILK, CREAM OR POWDERED CREAMER of any kind), and Gatorade    Enhanced Recovery after Surgery  Enhanced Recovery after Surgery is a protocol used to improve the stress on your body and your recovery after surgery.  Patient Instructions  The day of surgery (if you have diabetes):  Drink ONE small 12 oz bottle of Gatorade G2 by 7:30 am the morning of surgery This bottle was given to you during your hospital  pre-op appointment visit.  Nothing else to drink after completing the  Small 12 oz bottle of Gatorade G2.         If you have questions, please contact your surgeon's office.   Take these medicines the morning of surgery with A SIP OF WATER:   omeprazole (PRILOSEC)    lamoTRIgine (LAMICTAL)  busPIRone (BUSPAR)  azelastine (ASTELIN) 0.1 % nasal spray  APLENZIN  vortioxetine HBr (TRINTELLIX)   IF needed:  acetaminophen (TYLENOL)   ondansetron (ZOFRAN)   As of today, STOP taking any Aspirin (unless otherwise instructed by your surgeon) Aleve, Naproxen, Ibuprofen, Motrin, Advil, Goody's, BC's, all herbal medications, fish oil, and all vitamins.  Follow your surgeon's instructions on  when to stop Aspirin.  If no instructions were given by your surgeon then you will need to call the office to get those instructions.    WHAT DO I DO ABOUT MY DIABETES MEDICATION?  **Hold empagliflozin (JARDIANCE) 3 days prior to your surgery  **Hold tirzepatide [facility]) 7 days prior to your surgery  Do not take oral diabetes medicines metFORMIN (GLUCOPHAGE) (pills) the morning of surgery.  HOW TO MANAGE YOUR DIABETES BEFORE AND AFTER SURGERY  Why is it important to control my blood sugar before and after surgery? Improving blood sugar levels before and after surgery helps healing and can limit problems. A way of improving blood sugar control is eating a healthy diet by:  Eating less sugar and carbohydrates  Increasing activity/exercise  Talking with your doctor about reaching your blood sugar goals High blood sugars (greater than 180 mg/dL) can raise your risk of infections and slow your recovery, so you will need to focus on controlling your diabetes during the weeks before surgery. Make sure that the doctor who takes care of your diabetes knows about your planned surgery including the date and location.  How do I manage my blood sugar before surgery? Check your blood sugar at least 4 times a day, starting 2 days before surgery, to make sure that the level is not too high or low.  Check your blood sugar the morning of your surgery when you wake up and every 2 hours until you get to the Short Stay unit.  If your blood sugar is less than 70 mg/dL, you will need to treat for low blood sugar: Do not take insulin. Treat a low blood sugar (less than 70 mg/dL) with  cup of clear juice (cranberry or apple), 4 glucose tablets, OR glucose gel. Recheck blood sugar in 15 minutes after treatment (to make sure it is greater than 70 mg/dL). If your blood sugar is not greater than 70 mg/dL on recheck, call [phone] for further instructions. Report your blood sugar to the short stay nurse when  you get to Short Stay.  If you are admitted to the hospital after surgery: Your blood sugar will be checked by the staff and you will probably be given insulin after surgery (instead of oral diabetes medicines) to make sure you have good blood sugar levels. The goal for blood sugar control after surgery is 80-180 mg/dL.   The day of surgery:       Do not wear jewelry or makeup. Do not wear lotions, powders, perfumes or deodorant. Do not shave 48 hours prior to surgery.   Do not bring valuables to the hospital. Do not wear nail polish, gel polish, artificial nails, or any other type of covering on natural nails (fingers and toes) If you have artificial nails or gel coating that need to be removed by a nail salon, please have this removed prior to surgery. Artificial nails or gel coating may interfere with anesthesia's ability to adequately monitor your vital signs.  [facility] is not responsible for any belongings or valuables.    Do NOT Smoke (Tobacco/Vaping)  24 hours prior to your procedure  If you use a CPAP at night, you may bring your mask for your overnight stay.   Contacts, glasses, hearing aids, dentures or partials may not be worn into surgery, please bring cases for these belongings   For patients admitted to the hospital, discharge time will be determined by your treatment team.   Patients discharged the day of surgery will not be allowed to drive home, and someone needs to stay with them for 24 hours.   SURGICAL WAITING ROOM VISITATION Patients having surgery or a procedure may have no more than 2 support people in the waiting area - these visitors may rotate.   Children under the age of 60 must have an adult with them who is not the patient. If the patient needs to stay at the hospital during part of their recovery, the visitor guidelines for inpatient rooms apply. Pre-op nurse will coordinate an appropriate time for 1 support person to accompany patient in pre-op.  This  support person may not rotate.   Please refer to [email or web address] for the visitor guidelines for Inpatients (after your surgery is over and you are in a regular room).    Special instructions:    Oral Hygiene is also important to reduce your risk of infection.  Remember - BRUSH YOUR TEETH THE MORNING OF SURGERY WITH YOUR REGULAR TOOTHPASTE   [place]- Preparing For Surgery  Before surgery, you can play an important role. Because skin is not sterile, your skin needs to be as free of germs as possible. You can reduce the number of germs on your skin by washing with CHG (chlorahexidine gluconate) Soap before surgery.  CHG is an antiseptic cleaner which kills germs and bonds with the skin to continue killing germs even after washing.     Please do not use if you have an allergy to CHG or antibacterial soaps. If your  skin becomes reddened/irritated stop using the CHG.  Do not shave (including legs and underarms) for at least 48 hours prior to first CHG shower. It is OK to shave your face.  Please follow these instructions carefully.     Shower the NIGHT BEFORE SURGERY and the MORNING OF SURGERY with CHG Soap.   If you chose to wash your hair, wash your hair first as usual with your normal shampoo. After you shampoo, rinse your hair and body thoroughly to remove the shampoo.  Then [employer] and genitals (private parts) with your normal soap and rinse thoroughly to remove soap.  After that Use CHG Soap as you would any other liquid soap. You can apply CHG directly to the skin and wash gently with a scrungie or a clean washcloth.   Apply the CHG Soap to your body ONLY FROM THE NECK DOWN.  Do not use on open wounds or open sores. Avoid contact with your eyes, ears, mouth and genitals (private parts). Wash Face and genitals (private parts)  with your normal soap.   Wash thoroughly, paying special attention to the area where your surgery will  be performed.  Thoroughly rinse your body with warm water from the neck down.  DO NOT shower/wash with your normal soap after using and rinsing off the CHG Soap.  Pat yourself dry with a CLEAN TOWEL.  Wear CLEAN PAJAMAS to bed the night before surgery  Place CLEAN SHEETS on your bed the night before your surgery  DO NOT SLEEP WITH PETS.   Day of Surgery:  Take a shower with CHG soap. Wear Clean/Comfortable clothing the morning of surgery Do not apply any deodorants/lotions.   Remember to brush your teeth WITH YOUR REGULAR TOOTHPASTE.    If you received a COVID test during your pre-op visit, it is requested that you wear a mask when out in public, stay away from anyone that may not be feeling well, and notify your surgeon if you develop symptoms. If you have been in contact with anyone that has tested positive in the last 10 days, please notify your surgeon.    Please read over the following fact sheets that you were given.

## 2022-07-03 NOTE — Progress Notes (Addendum)
PCP - Lars Mage, NP Cardiologist - Buford Dresser, MD  PPM/ICD - denies Device Orders - n/a Rep Notified - n/a  Chest x-ray - n/a X-ray lumbar spine - 07/03/22 EKG - 06/26/22 Stress Test - denies ECHO - 12/24/21 Cardiac Cath - 12/24/21  Sleep Study - yes, positive for OSA CPAP - no  Fasting Blood Sugar - 120 - 180 Checks Blood Sugar 3 times/week CBG today - 120 A1C - 6.2 on 11.02.23  Last dose of GLP1 agonist-  06/24/22 per patient GLP1 instructions: Hold Mounjaro 7 days prior to your surgery  Blood Thinner Instructions: n/a Aspirin Instructions - last dose - 06/27/22 - per patient  Patient was instructed: As of today, STOP taking any Aspirin (unless otherwise instructed by your surgeon) Aleve, Naproxen, Ibuprofen, Motrin, Advil, Goody's, BC's, all herbal medications, fish oil, and all vitamins.   ERAS Protcol - yes PRE-SURGERY G2- yes, until 07:30 o'clock  COVID TEST- n/a   Anesthesia review: yes - cardiac history; cardiac clearance on 06/26/22  Patient denies shortness of breath, fever, cough and chest pain at PAT appointment   All instructions explained to the patient, with a verbal understanding of the material. Patient agrees to go over the instructions while at home for a better understanding. Patient also instructed to self quarantine after being tested for COVID-19. The opportunity to ask questions was provided.

## 2022-07-04 NOTE — Anesthesia Preprocedure Evaluation (Signed)
Anesthesia Evaluation  Patient identified by MRN, date of birth, ID band Patient awake    Reviewed: Allergy & Precautions, NPO status , Patient's Chart, lab work & pertinent test results  Airway Mallampati: II       Dental   Pulmonary sleep apnea    breath sounds clear to auscultation       Cardiovascular + Past MI   Rhythm:Regular Rate:Normal     Neuro/Psych    GI/Hepatic Neg liver ROS,GERD  ,,  Endo/Other  diabetes    Renal/GU negative Renal ROS     Musculoskeletal   Abdominal   Peds  Hematology   Anesthesia Other Findings   Reproductive/Obstetrics                             Anesthesia Physical Anesthesia Plan  ASA: 3  Anesthesia Plan: General   Post-op Pain Management:    Induction: Intravenous  PONV Risk Score and Plan: 3 and Ondansetron, Dexamethasone and Midazolam  Airway Management Planned: Oral ETT  Additional Equipment:   Intra-op Plan:   Post-operative Plan: Possible Post-op intubation/ventilation  Informed Consent: I have reviewed the patients History and Physical, chart, labs and discussed the procedure including the risks, benefits and alternatives for the proposed anesthesia with the patient or authorized representative who has indicated his/her understanding and acceptance.     Dental advisory given  Plan Discussed with: Anesthesiologist and CRNA  Anesthesia Plan Comments: (PAT note by Karoline Caldwell, PA-C: Follows with cardiology for hx of NSTEMI, Takotsubo syndrome. On 6/10/23she presented with NSTEMI. She had a widely patent left main and patent LAD with mild nonobstructive mid vessel plaque, angiographically normal left circumflex and normal LVEDP (cardiac cath 12/24/2021). Diagnosed with Takotsubo syndrome. Echo at that time showed EF 45-50%. Pt has been very hesitant to start new meds. Metoprolol caused fatigue and she declined to start propanolol.  ACEi/ARB/ARNI previously recommended but pt declined. Seen by Dr. Harrell Gave 06/26/22 for preop eval. Per note, "Preoperative cardiovascular evaluation According to the Revised Cardiac Risk Index (RCRI),her RCRI risk score is 1. The patient is not currently having active cardiac symptoms, and they can achieve >4 METs of activity. According to ACC/AHA Guidelines, no further testing is needed. Proceed with surgery at acceptable risk. Our service is available as needed in the peri-operative period. "  Hx of OSA, not on CPAP.  Hx of gastric bypass.   NIDDM2. A1c 6.2 on 05/17/22. Pt on GLP1-agonist Mounjaro. LD 06/24/22.  Proep labs reviewed, unremarkable.  EKG 06/26/22: NSR. Rate 88.  Cath 12/24/21: 1. Widely patent left main 2. Patent LAD with mild nonobstructive mid vessel plaquing 3. Angiographically normal left circumflex 4. Normal LVEDP Recommendations: The patient has mild nonobstructive coronary artery disease. I suspect she is having a non-coronary event such as atypical stress-induced event like acute Takotsubo syndrome. I reviewed her echo and her wall motion abnormality is focal involving the mid-septum. It does not appear to follow any typical coronary distribution. Recommend medical therapy.   Echo 12/24/21: 1. Akinesis of the midseptum with overall mild LV dysfunction. Left ventricular ejection fraction, by estimation, is 45 to 50%. The left ventricle has mildly decreased function. The left ventricle demonstrates regional wall motion abnormalities (see scoring diagram/findings for description). Left ventricular diastolic parameters are consistent with Grade I diastolic dysfunction (impaired relaxation). 2.Right ventricular systolic function is normal. The right ventricular size is normal. Tricuspid regurgitation signal is inadequate for assessing PA pressure. 3.The mitral  valve is normal in structure. Trivial mitral valve regurgitation. No evidence of mitral stenosis. 4.The  aortic valve is tricuspid. Aortic valve regurgitation is not visualized. No aortic stenosis is present. 5.The inferior vena cava is normal in size with greater than 50% respiratory variability, suggesting right atrial pressure of 3 mmHg.)        Anesthesia Quick Evaluation

## 2022-07-04 NOTE — Progress Notes (Signed)
Anesthesia Chart Review:  Follows with cardiology for hx of NSTEMI, Takotsubo syndrome. On 12/23/21 she presented with NSTEMI.  She had a widely patent left main and patent LAD with mild nonobstructive mid vessel plaque, angiographically normal left circumflex and normal LVEDP (cardiac cath 12/24/2021). Diagnosed with Takotsubo syndrome. Echo at that time showed EF 45-50%. Pt has been very hesitant to start new meds. Metoprolol caused fatigue and she declined to start propanolol. ACEi/ARB/ARNI previously recommended but pt declined. Seen by Dr. Harrell Gave 06/26/22 for preop eval. Per note, "Preoperative cardiovascular evaluation According to the Revised Cardiac Risk Index (RCRI), her RCRI risk score is 1. The patient is not currently having active cardiac symptoms, and they can achieve >4 METs of activity. According to ACC/AHA Guidelines, no further testing is needed.  Proceed with surgery at acceptable risk.  Our service is available as needed in the peri-operative period.   "  Hx of OSA, not on CPAP.  Hx of gastric bypass.   NIDDM2. A1c 6.2 on 05/17/22. Pt on GLP1-agonist Mounjaro. LD 06/24/22.  Proep labs reviewed, unremarkable.  EKG 06/26/22: NSR. Rate 88.  Cath 12/24/21: 1. Widely patent left main 2. Patent LAD with mild nonobstructive mid vessel plaquing 3. Angiographically normal left circumflex 4. Normal LVEDP Recommendations: The patient has mild nonobstructive coronary artery disease. I suspect she is having a non-coronary event such as atypical stress-induced event like acute Takotsubo syndrome. I reviewed her echo and her wall motion abnormality is focal involving the mid-septum. It does not appear to follow any typical coronary distribution. Recommend medical therapy.    Echo 12/24/21: 1. Akinesis of the midseptum with overall mild LV dysfunction. Left ventricular ejection fraction, by estimation, is 45 to 50%. The left ventricle has mildly decreased function. The left ventricle  demonstrates regional wall motion abnormalities (see scoring diagram/findings for description). Left ventricular diastolic parameters are consistent with Grade I diastolic dysfunction (impaired relaxation). 2.Right ventricular systolic function is normal. The right ventricular size is normal. Tricuspid regurgitation signal is inadequate for assessing PA pressure. 3.The mitral valve is normal in structure. Trivial mitral valve regurgitation. No evidence of mitral stenosis. 4.The aortic valve is tricuspid. Aortic valve regurgitation is not visualized. No aortic stenosis is present. 5.The inferior vena cava is normal in size with greater than 50% respiratory variability, suggesting right atrial pressure of 3 mmHg.   Wynonia Musty [facility] Short Stay Center/Anesthesiology Phone [phone] 07/04/2022 9:43 AM

## 2022-07-05 ENCOUNTER — Other Ambulatory Visit: Payer: Self-pay

## 2022-07-05 ENCOUNTER — Ambulatory Visit (HOSPITAL_BASED_OUTPATIENT_CLINIC_OR_DEPARTMENT_OTHER): Admitting: Anesthesiology

## 2022-07-05 ENCOUNTER — Ambulatory Visit (HOSPITAL_COMMUNITY)
Admission: RE | Admit: 2022-07-05 | Discharge: 2022-07-06 | Disposition: A | Discharge status: Home / Self Care | Source: Ambulatory Visit | Attending: Specialist | Admitting: Specialist

## 2022-07-05 ENCOUNTER — Ambulatory Visit (HOSPITAL_COMMUNITY)

## 2022-07-05 ENCOUNTER — Encounter (HOSPITAL_COMMUNITY)
Admission: RE | Disposition: A | Discharge status: Home / Self Care | Payer: Self-pay | Source: Ambulatory Visit | Attending: Specialist

## 2022-07-05 ENCOUNTER — Encounter (HOSPITAL_COMMUNITY): Payer: Self-pay | Admitting: Specialist

## 2022-07-05 ENCOUNTER — Ambulatory Visit (HOSPITAL_COMMUNITY): Admitting: Physician Assistant

## 2022-07-05 DIAGNOSIS — M48061 Spinal stenosis, lumbar region without neurogenic claudication: Secondary | ICD-10-CM

## 2022-07-05 DIAGNOSIS — I252 Old myocardial infarction: Secondary | ICD-10-CM

## 2022-07-05 DIAGNOSIS — Z7984 Long term (current) use of oral hypoglycemic drugs: Secondary | ICD-10-CM | POA: Insufficient documentation

## 2022-07-05 DIAGNOSIS — M5116 Intervertebral disc disorders with radiculopathy, lumbar region: Secondary | ICD-10-CM | POA: Insufficient documentation

## 2022-07-05 DIAGNOSIS — E119 Type 2 diabetes mellitus without complications: Secondary | ICD-10-CM

## 2022-07-05 DIAGNOSIS — G4730 Sleep apnea, unspecified: Secondary | ICD-10-CM

## 2022-07-05 DIAGNOSIS — Z7982 Long term (current) use of aspirin: Secondary | ICD-10-CM | POA: Insufficient documentation

## 2022-07-05 DIAGNOSIS — K219 Gastro-esophageal reflux disease without esophagitis: Secondary | ICD-10-CM | POA: Diagnosis not present

## 2022-07-05 DIAGNOSIS — Z7985 Long-term (current) use of injectable non-insulin antidiabetic drugs: Secondary | ICD-10-CM | POA: Diagnosis not present

## 2022-07-05 HISTORY — PX: LUMBAR LAMINECTOMY/DECOMPRESSION MICRODISCECTOMY: SHX5026

## 2022-07-05 LAB — GLUCOSE, CAPILLARY
Glucose-Capillary: 130 mg/dL — ABNORMAL HIGH (ref 70–99)
Glucose-Capillary: 97 mg/dL (ref 70–99)
Glucose-Capillary: 94 mg/dL (ref 70–99)
Glucose-Capillary: 212 mg/dL — ABNORMAL HIGH (ref 70–99)
Glucose-Capillary: 135 mg/dL — ABNORMAL HIGH (ref 70–99)

## 2022-07-05 SURGERY — LUMBAR LAMINECTOMY/DECOMPRESSION MICRODISCECTOMY 1 LEVEL
Anesthesia: General | Site: Back

## 2022-07-05 MED ORDER — ACETAMINOPHEN 10 MG/ML IV SOLN
INTRAVENOUS | Status: AC
  Filled 2022-07-05: qty 100

## 2022-07-05 MED ORDER — ROCURONIUM BROMIDE 10 MG/ML (PF) SYRINGE
PREFILLED_SYRINGE | INTRAVENOUS | Status: DC | PRN
  Administered 2022-07-05: 50 mg via INTRAVENOUS

## 2022-07-05 MED ORDER — DOCUSATE SODIUM 100 MG PO CAPS
100.0000 mg | ORAL_CAPSULE | Freq: Two times a day (BID) | ORAL | Status: DC
  Administered 2022-07-05: 100 mg via ORAL
  Filled 2022-07-05: qty 1

## 2022-07-05 MED ORDER — 0.9 % SODIUM CHLORIDE (POUR BTL) OPTIME
TOPICAL | Status: DC | PRN
  Administered 2022-07-05: 1000 mL

## 2022-07-05 MED ORDER — PROPOFOL 10 MG/ML IV BOLUS
INTRAVENOUS | Status: AC
  Filled 2022-07-05: qty 20

## 2022-07-05 MED ORDER — FENTANYL CITRATE (PF) 100 MCG/2ML IJ SOLN
INTRAMUSCULAR | Status: AC
  Filled 2022-07-05: qty 2

## 2022-07-05 MED ORDER — OXYCODONE HCL 5 MG PO TABS: 5.0000 mg | ORAL_TABLET | ORAL | 0 refills | Status: DC | PRN

## 2022-07-05 MED ORDER — MIDAZOLAM HCL 2 MG/2ML IJ SOLN
INTRAMUSCULAR | Status: DC | PRN
  Administered 2022-07-05: 2 mg via INTRAVENOUS

## 2022-07-05 MED ORDER — ADULT MULTIVITAMIN W/MINERALS CH
1.0000 | ORAL_TABLET | Freq: Every day | ORAL | Status: DC
  Administered 2022-07-05: 1 via ORAL
  Filled 2022-07-05: qty 1

## 2022-07-05 MED ORDER — POLYETHYLENE GLYCOL 3350 17 G PO PACK: 17.0000 g | PACK | Freq: Every day | ORAL | Status: DC | PRN

## 2022-07-05 MED ORDER — ACETAMINOPHEN 10 MG/ML IV SOLN
1000.0000 mg | INTRAVENOUS | Status: AC
  Administered 2022-07-05: 1000 mg via INTRAVENOUS

## 2022-07-05 MED ORDER — BUPIVACAINE-EPINEPHRINE 0.5% -1:200000 IJ SOLN
INTRAMUSCULAR | Status: DC | PRN
  Administered 2022-07-05: 7 mL

## 2022-07-05 MED ORDER — RISAQUAD PO CAPS
1.0000 | ORAL_CAPSULE | Freq: Every day | ORAL | Status: DC
  Administered 2022-07-05: 1 via ORAL
  Filled 2022-07-05: qty 1

## 2022-07-05 MED ORDER — MENTHOL 3 MG MT LOZG: 1.0000 | LOZENGE | OROMUCOSAL | Status: DC | PRN

## 2022-07-05 MED ORDER — HYDROMORPHONE HCL 1 MG/ML IJ SOLN
0.5000 mg | INTRAMUSCULAR | Status: DC | PRN
  Administered 2022-07-05: 0.5 mg via INTRAVENOUS
  Filled 2022-07-05: qty 0.5

## 2022-07-05 MED ORDER — INSULIN ASPART 100 UNIT/ML IJ SOLN: 0.0000 [IU] | INTRAMUSCULAR | Status: DC | PRN

## 2022-07-05 MED ORDER — LAMOTRIGINE 100 MG PO TABS: 200.0000 mg | ORAL_TABLET | Freq: Every day | ORAL | Status: DC

## 2022-07-05 MED ORDER — QUETIAPINE FUMARATE 50 MG PO TABS
75.0000 mg | ORAL_TABLET | Freq: Every day | ORAL | Status: DC
  Administered 2022-07-05: 75 mg via ORAL
  Filled 2022-07-05: qty 1

## 2022-07-05 MED ORDER — TRANEXAMIC ACID-NACL 1000-0.7 MG/100ML-% IV SOLN
1000.0000 mg | Freq: Once | INTRAVENOUS | Status: AC
  Administered 2022-07-05: 1000 mg via INTRAVENOUS
  Filled 2022-07-05: qty 100

## 2022-07-05 MED ORDER — CHLORHEXIDINE GLUCONATE 0.12 % MT SOLN: 15.0000 mL | Freq: Once | OROMUCOSAL | Status: AC

## 2022-07-05 MED ORDER — LITHIUM CARBONATE 300 MG PO CAPS
300.0000 mg | ORAL_CAPSULE | Freq: Every day | ORAL | Status: DC
  Administered 2022-07-05: 300 mg via ORAL
  Filled 2022-07-05: qty 1

## 2022-07-05 MED ORDER — METHOCARBAMOL 1000 MG/10ML IJ SOLN: 500.0000 mg | Freq: Four times a day (QID) | INTRAVENOUS | Status: DC | PRN

## 2022-07-05 MED ORDER — PHENYLEPHRINE 80 MCG/ML (10ML) SYRINGE FOR IV PUSH (FOR BLOOD PRESSURE SUPPORT)
PREFILLED_SYRINGE | INTRAVENOUS | Status: DC | PRN
  Administered 2022-07-05: 80 ug via INTRAVENOUS

## 2022-07-05 MED ORDER — CEFAZOLIN SODIUM-DEXTROSE 2-4 GM/100ML-% IV SOLN
2.0000 g | INTRAVENOUS | Status: AC
  Administered 2022-07-05: 2 g via INTRAVENOUS

## 2022-07-05 MED ORDER — PANTOPRAZOLE SODIUM 40 MG PO TBEC: 40.0000 mg | DELAYED_RELEASE_TABLET | Freq: Every day | ORAL | Status: DC

## 2022-07-05 MED ORDER — AZELASTINE HCL 0.1 % NA SOLN: 1.0000 | Freq: Two times a day (BID) | NASAL | Status: DC | PRN

## 2022-07-05 MED ORDER — BUPROPION HBR ER 522 MG PO TB24: 522.0000 mg | ORAL_TABLET | Freq: Every day | ORAL | Status: DC

## 2022-07-05 MED ORDER — PROPOFOL 10 MG/ML IV BOLUS
INTRAVENOUS | Status: DC | PRN
  Administered 2022-07-05: 140 mg via INTRAVENOUS

## 2022-07-05 MED ORDER — BISMUTH SUBSALICYLATE 262 MG/15ML PO SUSP: 30.0000 mL | ORAL | Status: DC | PRN

## 2022-07-05 MED ORDER — MONTELUKAST SODIUM 10 MG PO TABS
10.0000 mg | ORAL_TABLET | Freq: Every day | ORAL | Status: DC
  Administered 2022-07-05: 10 mg via ORAL
  Filled 2022-07-05: qty 1

## 2022-07-05 MED ORDER — OXYCODONE HCL 5 MG PO TABS
5.0000 mg | ORAL_TABLET | ORAL | Status: DC | PRN
  Administered 2022-07-05 – 2022-07-06 (×4): 5 mg via ORAL
  Filled 2022-07-05 (×4): qty 1

## 2022-07-05 MED ORDER — BUSPIRONE HCL 10 MG PO TABS
10.0000 mg | ORAL_TABLET | Freq: Two times a day (BID) | ORAL | Status: DC
  Administered 2022-07-05: 10 mg via ORAL
  Filled 2022-07-05: qty 1

## 2022-07-05 MED ORDER — CEFAZOLIN SODIUM-DEXTROSE 2-4 GM/100ML-% IV SOLN
2.0000 g | Freq: Three times a day (TID) | INTRAVENOUS | Status: AC
  Administered 2022-07-05 – 2022-07-06 (×2): 2 g via INTRAVENOUS
  Filled 2022-07-05 (×2): qty 100

## 2022-07-05 MED ORDER — SUGAMMADEX SODIUM 200 MG/2ML IV SOLN
INTRAVENOUS | Status: DC | PRN
  Administered 2022-07-05: 200 mg via INTRAVENOUS

## 2022-07-05 MED ORDER — PHENYLEPHRINE HCL-NACL 20-0.9 MG/250ML-% IV SOLN
INTRAVENOUS | Status: DC | PRN
  Administered 2022-07-05: 50 ug/min via INTRAVENOUS

## 2022-07-05 MED ORDER — POLYETHYLENE GLYCOL 3350 17 G PO PACK: 17.0000 g | PACK | Freq: Every day | ORAL | 0 refills | Status: DC

## 2022-07-05 MED ORDER — ORAL CARE MOUTH RINSE: 15.0000 mL | Freq: Once | OROMUCOSAL | Status: AC

## 2022-07-05 MED ORDER — LORATADINE 10 MG PO TABS
10.0000 mg | ORAL_TABLET | Freq: Every evening | ORAL | Status: DC
  Administered 2022-07-05: 10 mg via ORAL
  Filled 2022-07-05: qty 1

## 2022-07-05 MED ORDER — CHLORHEXIDINE GLUCONATE 0.12 % MT SOLN
OROMUCOSAL | Status: AC
  Administered 2022-07-05: 15 mL via OROMUCOSAL
  Filled 2022-07-05: qty 15

## 2022-07-05 MED ORDER — THROMBIN 20000 UNITS EX SOLR
CUTANEOUS | Status: AC
  Filled 2022-07-05: qty 20000

## 2022-07-05 MED ORDER — EMPAGLIFLOZIN 10 MG PO TABS
10.0000 mg | ORAL_TABLET | Freq: Every day | ORAL | Status: DC
  Administered 2022-07-06: 10 mg via ORAL
  Filled 2022-07-05: qty 1

## 2022-07-05 MED ORDER — KCL IN DEXTROSE-NACL 20-5-0.45 MEQ/L-%-% IV SOLN
INTRAVENOUS | Status: DC
  Filled 2022-07-05: qty 1000

## 2022-07-05 MED ORDER — ACETAMINOPHEN 325 MG PO TABS
650.0000 mg | ORAL_TABLET | ORAL | Status: DC | PRN
  Administered 2022-07-06: 650 mg via ORAL
  Filled 2022-07-05: qty 2

## 2022-07-05 MED ORDER — LACTATED RINGERS IV SOLN: INTRAVENOUS | Status: DC

## 2022-07-05 MED ORDER — ACETAMINOPHEN 650 MG RE SUPP: 650.0000 mg | RECTAL | Status: DC | PRN

## 2022-07-05 MED ORDER — MIDAZOLAM HCL 2 MG/2ML IJ SOLN
INTRAMUSCULAR | Status: AC
  Filled 2022-07-05: qty 2

## 2022-07-05 MED ORDER — ALUM & MAG HYDROXIDE-SIMETH 200-200-20 MG/5ML PO SUSP: 30.0000 mL | Freq: Four times a day (QID) | ORAL | Status: DC | PRN

## 2022-07-05 MED ORDER — METHOCARBAMOL 500 MG PO TABS
500.0000 mg | ORAL_TABLET | Freq: Four times a day (QID) | ORAL | Status: DC | PRN
  Administered 2022-07-05 – 2022-07-06 (×3): 500 mg via ORAL
  Filled 2022-07-05 (×3): qty 1

## 2022-07-05 MED ORDER — PHENOL 1.4 % MT LIQD: 1.0000 | OROMUCOSAL | Status: DC | PRN

## 2022-07-05 MED ORDER — ONDANSETRON HCL 4 MG PO TABS
8.0000 mg | ORAL_TABLET | Freq: Three times a day (TID) | ORAL | Status: DC | PRN
  Administered 2022-07-05: 8 mg via ORAL
  Filled 2022-07-05: qty 2

## 2022-07-05 MED ORDER — FENTANYL CITRATE (PF) 250 MCG/5ML IJ SOLN
INTRAMUSCULAR | Status: AC
  Filled 2022-07-05: qty 5

## 2022-07-05 MED ORDER — TIRZEPATIDE 2.5 MG/0.5ML ~~LOC~~ SOAJ: 2.5000 mg | SUBCUTANEOUS | Status: DC

## 2022-07-05 MED ORDER — TRANEXAMIC ACID-NACL 1000-0.7 MG/100ML-% IV SOLN
1000.0000 mg | INTRAVENOUS | Status: AC
  Administered 2022-07-05: 1000 mg via INTRAVENOUS

## 2022-07-05 MED ORDER — INSULIN ASPART 100 UNIT/ML IJ SOLN
0.0000 [IU] | Freq: Three times a day (TID) | INTRAMUSCULAR | Status: DC
  Administered 2022-07-05: 5 [IU] via SUBCUTANEOUS

## 2022-07-05 MED ORDER — LIDOCAINE 2% (20 MG/ML) 5 ML SYRINGE
INTRAMUSCULAR | Status: DC | PRN
  Administered 2022-07-05: 30 mg via INTRAVENOUS

## 2022-07-05 MED ORDER — BISACODYL 5 MG PO TBEC: 5.0000 mg | DELAYED_RELEASE_TABLET | Freq: Every day | ORAL | Status: DC | PRN

## 2022-07-05 MED ORDER — FENTANYL CITRATE (PF) 250 MCG/5ML IJ SOLN
INTRAMUSCULAR | Status: DC | PRN
  Administered 2022-07-05: 150 ug via INTRAVENOUS

## 2022-07-05 MED ORDER — THROMBIN 20000 UNITS EX SOLR: CUTANEOUS | Status: DC | PRN

## 2022-07-05 MED ORDER — QUETIAPINE FUMARATE 25 MG PO TABS: 25.0000 mg | ORAL_TABLET | Freq: Every day | ORAL | Status: DC

## 2022-07-05 MED ORDER — DOCUSATE SODIUM 100 MG PO CAPS: 100.0000 mg | ORAL_CAPSULE | Freq: Two times a day (BID) | ORAL | 1 refills | Status: DC | PRN

## 2022-07-05 MED ORDER — MAGNESIUM CITRATE PO SOLN: 1.0000 | Freq: Once | ORAL | Status: DC | PRN

## 2022-07-05 MED ORDER — TRANEXAMIC ACID-NACL 1000-0.7 MG/100ML-% IV SOLN
INTRAVENOUS | Status: AC
  Filled 2022-07-05: qty 100

## 2022-07-05 MED ORDER — CALCIUM CARBONATE 1250 (500 CA) MG PO TABS
625.0000 mg | ORAL_TABLET | Freq: Two times a day (BID) | ORAL | Status: DC
  Administered 2022-07-05: 625 mg via ORAL
  Filled 2022-07-05 (×2): qty 0.5

## 2022-07-05 MED ORDER — FENTANYL CITRATE (PF) 100 MCG/2ML IJ SOLN
25.0000 ug | INTRAMUSCULAR | Status: DC | PRN
  Administered 2022-07-05: 50 ug via INTRAVENOUS

## 2022-07-05 MED ORDER — VITAMIN D 25 MCG (1000 UNIT) PO TABS
5000.0000 [IU] | ORAL_TABLET | Freq: Every day | ORAL | Status: DC
  Administered 2022-07-05: 5000 [IU] via ORAL
  Filled 2022-07-05: qty 5

## 2022-07-05 MED ORDER — FENTANYL CITRATE (PF) 100 MCG/2ML IJ SOLN: 25.0000 ug | INTRAMUSCULAR | Status: DC | PRN

## 2022-07-05 MED ORDER — VORTIOXETINE HBR 5 MG PO TABS
10.0000 mg | ORAL_TABLET | Freq: Two times a day (BID) | ORAL | Status: DC
  Administered 2022-07-05 – 2022-07-06 (×2): 10 mg via ORAL
  Filled 2022-07-05 (×2): qty 2

## 2022-07-05 MED ORDER — ONDANSETRON HCL 4 MG/2ML IJ SOLN
INTRAMUSCULAR | Status: DC | PRN
  Administered 2022-07-05: 4 mg via INTRAVENOUS

## 2022-07-05 MED ORDER — QUETIAPINE FUMARATE 50 MG PO TABS: 50.0000 mg | ORAL_TABLET | Freq: Every day | ORAL | Status: DC

## 2022-07-05 MED ORDER — BUPIVACAINE-EPINEPHRINE (PF) 0.5% -1:200000 IJ SOLN
INTRAMUSCULAR | Status: AC
  Filled 2022-07-05: qty 30

## 2022-07-05 MED ORDER — CEFAZOLIN SODIUM-DEXTROSE 2-4 GM/100ML-% IV SOLN
INTRAVENOUS | Status: AC
  Filled 2022-07-05: qty 100

## 2022-07-05 SURGICAL SUPPLY — 59 items
BAG COUNTER SPONGE SURGICOUNT (BAG) ×1 IMPLANT
BAG DECANTER FOR FLEXI CONT (MISCELLANEOUS) IMPLANT
BAG SPNG CNTER NS LX DISP (BAG) ×1
BAND INSRT 18 STRL LF DISP RB (MISCELLANEOUS) ×2
BAND RUBBER #18 3X1/16 STRL (MISCELLANEOUS) ×2 IMPLANT
BUR EGG ELITE 5.0 (BURR) IMPLANT
BUR RND DIAMOND ELITE 4.0 (BURR) IMPLANT
CLEANER TIP ELECTROSURG 2X2 (MISCELLANEOUS) ×1 IMPLANT
CLSR STERI-STRIP ANTIMIC 1/2X4 (GAUZE/BANDAGES/DRESSINGS) IMPLANT
CNTNR URN SCR LID CUP LEK RST (MISCELLANEOUS) ×1 IMPLANT
CONT SPEC 4OZ STRL OR WHT (MISCELLANEOUS) ×1
DRAPE LAPAROTOMY 100X72X124 (DRAPES) ×1 IMPLANT
DRAPE MICROSCOPE SLANT 54X150 (MISCELLANEOUS) ×1 IMPLANT
DRAPE SHEET LG 3/4 BI-LAMINATE (DRAPES) ×1 IMPLANT
DRAPE SURG 17X11 SM STRL (DRAPES) ×1 IMPLANT
DRAPE UTILITY XL STRL (DRAPES) ×1 IMPLANT
DRSG AQUACEL AG ADV 3.5X 4 (GAUZE/BANDAGES/DRESSINGS) IMPLANT
DRSG AQUACEL AG ADV 3.5X 6 (GAUZE/BANDAGES/DRESSINGS) IMPLANT
DRSG TELFA 3X8 NADH STRL (GAUZE/BANDAGES/DRESSINGS) IMPLANT
DURAPREP 26ML APPLICATOR (WOUND CARE) ×1 IMPLANT
DURASEAL SPINE SEALANT 3ML (MISCELLANEOUS) IMPLANT
ELECT BLADE 4.0 EZ CLEAN MEGAD (MISCELLANEOUS)
ELECT REM PT RETURN 9FT ADLT (ELECTROSURGICAL) ×1
ELECTRODE BLDE 4.0 EZ CLN MEGD (MISCELLANEOUS) IMPLANT
ELECTRODE REM PT RTRN 9FT ADLT (ELECTROSURGICAL) ×1 IMPLANT
GLOVE BIOGEL PI IND STRL 7.5 (GLOVE) ×1 IMPLANT
GLOVE SURG SS PI 7.0 STRL IVOR (GLOVE) ×1 IMPLANT
GLOVE SURG SS PI 8.0 STRL IVOR (GLOVE) ×2 IMPLANT
GOWN STRL REUS W/ TWL LRG LVL3 (GOWN DISPOSABLE) ×1 IMPLANT
GOWN STRL REUS W/ TWL XL LVL3 (GOWN DISPOSABLE) ×1 IMPLANT
GOWN STRL REUS W/TWL LRG LVL3 (GOWN DISPOSABLE) ×1
GOWN STRL REUS W/TWL XL LVL3 (GOWN DISPOSABLE) ×1
IV CATH 14GX2 1/4 (CATHETERS) ×1 IMPLANT
KIT BASIN OR (CUSTOM PROCEDURE TRAY) ×1 IMPLANT
NDL 22X1.5 STRL (OR ONLY) (MISCELLANEOUS) ×1 IMPLANT
NDL SPNL 18GX3.5 QUINCKE PK (NEEDLE) ×2 IMPLANT
NEEDLE 22X1.5 STRL (OR ONLY) (MISCELLANEOUS) ×1 IMPLANT
NEEDLE SPNL 18GX3.5 QUINCKE PK (NEEDLE) ×2 IMPLANT
PACK LAMINECTOMY NEURO (CUSTOM PROCEDURE TRAY) ×1 IMPLANT
PATTIES SURGICAL .75X.75 (GAUZE/BANDAGES/DRESSINGS) ×1 IMPLANT
SOLUTION PRONTOSAN WOUND 350ML (IRRIGATION / IRRIGATOR) IMPLANT
SPONGE SURGIFOAM ABS GEL 100 (HEMOSTASIS) ×1 IMPLANT
SPONGE T-LAP 4X18 ~~LOC~~+RFID (SPONGE) IMPLANT
STAPLER VISISTAT (STAPLE) IMPLANT
STRIP CLOSURE SKIN 1/2X4 (GAUZE/BANDAGES/DRESSINGS) ×1 IMPLANT
SUT NURALON 4 0 TR CR/8 (SUTURE) IMPLANT
SUT PROLENE 3 0 PS 2 (SUTURE) IMPLANT
SUT VIC AB 1 CT1 27 (SUTURE)
SUT VIC AB 1 CT1 27XBRD ANTBC (SUTURE) IMPLANT
SUT VIC AB 1-0 CT2 27 (SUTURE) IMPLANT
SUT VIC AB 2-0 CT1 27 (SUTURE)
SUT VIC AB 2-0 CT1 TAPERPNT 27 (SUTURE) IMPLANT
SUT VIC AB 2-0 CT2 27 (SUTURE) IMPLANT
SYR 3ML LL SCALE MARK (SYRINGE) ×1 IMPLANT
TOWEL GREEN STERILE (TOWEL DISPOSABLE) ×1 IMPLANT
TOWEL GREEN STERILE FF (TOWEL DISPOSABLE) ×1 IMPLANT
TRAY FOLEY MTR SLVR 16FR STAT (SET/KITS/TRAYS/PACK) ×1 IMPLANT
WIPE CHG 2% 2PK PREOPERATIVE (MISCELLANEOUS) ×1 IMPLANT
YANKAUER SUCT BULB TIP NO VENT (SUCTIONS) ×1 IMPLANT

## 2022-07-05 NOTE — Brief Op Note (Addendum)
07/05/2022  12:23 PM  PATIENT:  Claretha Cooper Sween  56 y.o. female  PRE-OPERATIVE DIAGNOSIS:  Spinal stenosis Lumbar four-five  POST-OPERATIVE DIAGNOSIS:  Spinal stenosis Lumbar four-five  PROCEDURE:  Procedure(s) with comments: Bilateral Hemilaminotomy microdiscectomy Lumbar four-five (N/A) - 2 hrs 3 C-Bed  SURGEON:  Surgeon(s) and Role:    Susa Day, MD - Primary  PHYSICIAN ASSISTANT:   ASSISTANTS: Bissell   ANESTHESIA:   general  EBL:  25 mL   BLOOD ADMINISTERED:none  DRAINS: none   LOCAL MEDICATIONS USED:  MARCAINE     SPECIMEN:  No Specimen  DISPOSITION OF SPECIMEN:  N/A  COUNTS:  YES  TOURNIQUET:  none  DICTATION: .Other Dictation: Dictation Number [id number]  PLAN OF CARE: Admit for overnight observation  PATIENT DISPOSITION:  PACU - hemodynamically stable.   Delay start of Pharmacological VTE agent (>24hrs) due to surgical blood loss or risk of bleeding: yes

## 2022-07-05 NOTE — Transfer of Care (Signed)
Immediate Anesthesia Transfer of Care Note  Patient: Jessica Bridges  Procedure(s) Performed: Bilateral Hemilaminotomy microdiscectomy Lumbar four-five (Back)  Patient Location: PACU  Anesthesia Type:General  Level of Consciousness: awake and alert   Airway & Oxygen Therapy: Patient Spontanous Breathing  Post-op Assessment: Report given to RN and Post -op Vital signs reviewed and stable  Post vital signs: Reviewed and stable  Last Vitals:  Vitals Value Taken Time  BP 135/74 07/05/22 1235  Temp 36.7 C 07/05/22 1235  Pulse 91 07/05/22 1241  Resp 16 07/05/22 1241  SpO2 96 % 07/05/22 1241  Vitals shown include unvalidated device data.  Last Pain:  Vitals:   07/05/22 0852  TempSrc:   PainSc: 4       Patients Stated Pain Goal: 0 (46/27/03 5009)  Complications: No notable events documented.

## 2022-07-05 NOTE — Interval H&P Note (Signed)
History and Physical Interval Note:  07/05/2022 9:55 AM  Jessica Bridges  has presented today for surgery, with the diagnosis of Spinal stenosis L4-5.  The various methods of treatment have been discussed with the patient and family. After consideration of risks, benefits and other options for treatment, the patient has consented to  Procedure(s) with comments: Bilateral Hemilaminotomy microdiscectomy L4-5 (N/A) - 2 hrs 3 C-Bed as a surgical intervention.  The patient's history has been reviewed, patient examined, no change in status, stable for surgery.  I have reviewed the patient's chart and labs.  Questions were answered to the patient's satisfaction.     Johnn Hai

## 2022-07-05 NOTE — Anesthesia Procedure Notes (Signed)
Procedure Name: Intubation Date/Time: 07/05/2022 10:49 AM  Performed by: Eligha Bridegroom, CRNAPre-anesthesia Checklist: Patient identified, Emergency Drugs available, Suction available, Patient being monitored and Timeout performed Patient Re-evaluated:Patient Re-evaluated prior to induction Oxygen Delivery Method: Circle system utilized Preoxygenation: Pre-oxygenation with 100% oxygen Induction Type: IV induction Ventilation: Mask ventilation without difficulty Laryngoscope Size: Mac and 3 Grade View: Grade III Tube type: Oral Tube size: 7.0 mm Number of attempts: 1 Airway Equipment and Method: Stylet Placement Confirmation: ETT inserted through vocal cords under direct vision, positive ETCO2 and breath sounds checked- equal and bilateral Secured at: 21 cm Tube secured with: Tape Dental Injury: Teeth and Oropharynx as per pre-operative assessment

## 2022-07-05 NOTE — Anesthesia Postprocedure Evaluation (Signed)
Anesthesia Post Note  Patient: Jessica Bridges  Procedure(s) Performed: Bilateral Hemilaminotomy microdiscectomy Lumbar four-five (Back)     Patient location during evaluation: PACU Anesthesia Type: General Level of consciousness: awake Pain management: pain level controlled Vital Signs Assessment: post-procedure vital signs reviewed and stable Respiratory status: spontaneous breathing Cardiovascular status: stable Postop Assessment: no apparent nausea or vomiting Anesthetic complications: no   No notable events documented.  Last Vitals:  Vitals:   07/05/22 1315 07/05/22 1330  BP: 131/70 130/74  Pulse: 84 82  Resp: 11 12  Temp:  36.7 C  SpO2: 98% 98%    Last Pain:  Vitals:   07/05/22 1245  TempSrc:   PainSc: 8                  Amiel Sharrow

## 2022-07-05 NOTE — Progress Notes (Signed)
Pt's incision is swollen and is hard to touch. Ice has been applied. Dr. Tonita Cong notified and orders were given. Pressure dressing applied. Will continue to monitor. Holli Humbles, RN

## 2022-07-05 NOTE — Discharge Instructions (Signed)

## 2022-07-06 ENCOUNTER — Encounter (HOSPITAL_COMMUNITY): Payer: Self-pay | Admitting: Specialist

## 2022-07-06 DIAGNOSIS — M48061 Spinal stenosis, lumbar region without neurogenic claudication: Secondary | ICD-10-CM | POA: Diagnosis not present

## 2022-07-06 LAB — HEMOGLOBIN A1C
Hgb A1c MFr Bld: 6.6 % — ABNORMAL HIGH (ref 4.8–5.6)
Mean Plasma Glucose: 143 mg/dL

## 2022-07-06 LAB — GLUCOSE, CAPILLARY: Glucose-Capillary: 104 mg/dL — ABNORMAL HIGH (ref 70–99)

## 2022-07-06 MED ORDER — METHOCARBAMOL 500 MG PO TABS: 500.0000 mg | ORAL_TABLET | Freq: Four times a day (QID) | ORAL | 1 refills | Status: DC | PRN

## 2022-07-06 NOTE — Progress Notes (Signed)
Subjective: 1 Day Post-Op Procedure(s) (LRB): Bilateral Hemilaminotomy microdiscectomy Lumbar four-five (N/A) Patient reports pain as mild and moderate.    Objective: Vital signs in last 24 hours: Temp:  [97.7 F (36.5 C)-98.5 F (36.9 C)] 98.2 F (36.8 C) (12/22 0722) Pulse Rate:  [72-91] 72 (12/22 0722) Resp:  [11-23] 18 (12/22 0722) BP: (97-137)/(53-80) 97/53 (12/22 0722) SpO2:  [96 %-100 %] 100 % (12/22 0722) Weight:  [64.4 kg] 64.4 kg (12/21 0837)  Intake/Output from previous day: 12/21 0701 - 12/22 0700 In: 1340 [P.O.:240; I.V.:1000; IV Piggyback:100] Out: 125 [Urine:100; Blood:25] Intake/Output this shift: No intake/output data recorded.  Recent Labs    07/03/22 1530  HGB 12.1   Recent Labs    07/03/22 1530  WBC 10.1  RBC 4.02  HCT 35.8*  PLT 273   Recent Labs    07/03/22 1530  NA 135  K 3.9  CL 100  CO2 24  BUN 13  CREATININE 0.95  GLUCOSE 127*  CALCIUM 9.5   No results for input(s): "LABPT", "INR" in the last 72 hours.  Neurologically intact ABD soft Neurovascular intact Sensation intact distally Intact pulses distally Dorsiflexion/Plantar flexion intact Incision: dressing C/D/I and no drainage No cellulitis present Compartment soft No sign of DVT Swelling proximal half of incision, soft   Assessment/Plan: 1 Day Post-Op Procedure(s) (LRB): Bilateral Hemilaminotomy microdiscectomy Lumbar four-five (N/A) Advance diet Up with therapy D/C IV fluids Continue to ice at home D/C to home Discussed D/C instructions, dressing instructions, LSpine precautions Discussed w Dr Valentino Saxon Deatra Robinson 07/06/2022, 7:54 AM

## 2022-07-06 NOTE — Evaluation (Signed)
Occupational Therapy Evaluation Patient Details Name: Jessica Bridges MRN: [id number] DOB: 1965/08/24 Today's Date: 07/06/2022   History of Present Illness Pt is a 56 y.o. female s/p bilateral hemilaminotomy microdiscectomy L4-5. PMH significant for DM, gastric bypass, MI, sleep apnea.   Clinical Impression   PTA, pt lived with her husband and was independent. Upon eval, pt performing ADL at mod I level with exception of shower transfer in which she benefited from supervision for safety. Pt educated and demonstrating use of compensatory techniques for bed mobility, LB dressing, grooming, toileting, and tub-shower transfers within precautions. Recommending discharge home with no follow up OT at this time. OT to sign off.      Recommendations for follow up therapy are one component of a multi-disciplinary discharge planning process, led by the attending physician.  Recommendations may be updated based on patient status, additional functional criteria and insurance authorization.   Follow Up Recommendations  No OT follow up     Assistance Recommended at Discharge PRN  Patient can return home with the following Assist for transportation    Functional Status Assessment  Patient has had a recent decline in their functional status and demonstrates the ability to make significant improvements in function in a reasonable and predictable amount of time.  Equipment Recommendations  None recommended by OT (pt does not want BSC)    Recommendations for Other Services       Precautions / Restrictions Precautions Precautions: Back Precaution Booklet Issued: Yes (comment) Precaution Comments: All precautions reviewed within the context of ADL. Required Braces or Orthoses:  (no brace needed orders) Restrictions Weight Bearing Restrictions: No      Mobility Bed Mobility                    Transfers Overall transfer level: Modified independent                 General transfer  comment: slow rise      Balance                                           ADL either performed or assessed with clinical judgement   ADL Overall ADL's : Needs assistance/impaired;Modified independent Eating/Feeding: Modified independent   Grooming: Supervision/safety;Standing   Upper Body Bathing: Set up;Sitting   Lower Body Bathing: Supervison/ safety                   Tub/ Shower Transfer: Tub transfer;Supervision/safety Tub/Shower Transfer Details (indicate cue type and reason): supervision for safety         Vision Baseline Vision/History: 1 Wears glasses Ability to See in Adequate Light: 0 Adequate Patient Visual Report: No change from baseline Vision Assessment?: No apparent visual deficits     Perception Perception Perception Tested?: No   Praxis Praxis Praxis tested?: Not tested    Pertinent Vitals/Pain Pain Assessment Pain Assessment: Faces Faces Pain Scale: Hurts little more Pain Location: operative site Pain Descriptors / Indicators: Sore, Operative site guarding Pain Intervention(s): Limited activity within patient's tolerance, Monitored during session     Hand Dominance     Extremity/Trunk Assessment Upper Extremity Assessment Upper Extremity Assessment: Overall WFL for tasks assessed   Lower Extremity Assessment Lower Extremity Assessment: Defer to PT evaluation   Cervical / Trunk Assessment Cervical / Trunk Assessment: Back Surgery   Communication Communication Communication: No difficulties  Cognition Arousal/Alertness: Awake/alert Behavior During Therapy: WFL for tasks assessed/performed Overall Cognitive Status: Within Functional Limits for tasks assessed                                       General Comments  All education provided. Pt verbalizing undersatnding and able to self correct when breaking precautions. Reports she believes this will be difficult for her to maintain althrough doing  well in session; reinforcing that making good habits earlier result in ease of maintaining as she feels more mobile as well    Exercises     Shoulder Instructions      Home Living Family/patient expects to be discharged to:: Private residence Living Arrangements: Spouse/significant other Available Help at Discharge: Family Type of Home: House Home Access: Stairs to enter Technical brewer of Steps: 7 Entrance Stairs-Rails: Right;Left;Can reach both Home Layout: One level     Bathroom Shower/Tub: Teacher, early years/pre: Standard Bathroom Accessibility: No   Home Equipment: None          Prior Functioning/Environment Prior Level of Function : Independent/Modified Independent;Working/employed;Driving             Mobility Comments: no AD ADLs Comments: 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        OT Problem List: Decreased strength;Impaired balance (sitting and/or standing);Decreased activity tolerance      OT Treatment/Interventions:      OT Goals(Current goals can be found in the care plan section) Acute Rehab OT Goals Patient Stated Goal: go home OT Goal Formulation: With patient  OT Frequency:      Co-evaluation              AM-PAC OT "6 Clicks" Daily Activity     Outcome Measure Help from another person eating meals?: None Help from another person taking care of personal grooming?: None Help from another person toileting, which includes using toliet, bedpan, or urinal?: None Help from another person bathing (including washing, rinsing, drying)?: None Help from another person to put on and taking off regular upper body clothing?: None Help from another person to put on and taking off regular lower body clothing?: None 6 Click Score: 24   End of Session Equipment Utilized During Treatment: Gait belt;Rolling walker (2 wheels) Nurse Communication: Mobility status  Activity Tolerance: Patient tolerated treatment well Patient left: in  bed;with call bell/phone within reach  OT Visit Diagnosis: Unsteadiness on feet (R26.81);Muscle weakness (generalized) (M62.81);Pain Pain - part of body:  (operative site)                Time: 3428-7681 OT Time Calculation (min): 26 min Charges:  OT General Charges $OT Visit: 1 Visit OT Evaluation $OT Eval Low Complexity: 1 Low OT Treatments $Self Care/Home Management : 8-22 mins  Elder Cyphers, OTR/L [facility] Acute Rehabilitation Office: [phone]    Magnus Ivan 07/06/2022, 11:34 AM

## 2022-07-06 NOTE — Evaluation (Signed)
Physical Therapy Evaluation Patient Details Name: Jessica Bridges MRN: [id number] DOB: 1966-01-18 Today's Date: 07/06/2022  History of Present Illness  Pt is a 56 y.o. female s/p bilateral hemilaminotomy microdiscectomy L4-5. PMH significant for DM, gastric bypass, MI, sleep apnea.  Clinical Impression   Patient evaluated by Physical Therapy with no further acute PT needs identified. All education has been completed and the patient has no further questions. Questions solicited and answered;  See below for any follow-up Physical Therapy or equipment needs. PT is signing off. Thank you for this referral.        Recommendations for follow up therapy are one component of a multi-disciplinary discharge planning process, led by the attending physician.  Recommendations may be updated based on patient status, additional functional criteria and insurance authorization.  Follow Up Recommendations  (The potential need for Outpatient PT can be addressed at Ortho follow-up appointments. )      Assistance Recommended at Discharge PRN  Patient can return home with the following       Equipment Recommendations Rolling walker (2 wheels)  Recommendations for Other Services       Functional Status Assessment       Precautions / Restrictions Precautions Precautions: Back Precaution Booklet Issued: Yes (comment) Precaution Comments: All precautions reviewed within the context of ADL. Required Braces or Orthoses:  (no brace needed orders) Restrictions Weight Bearing Restrictions: No      Mobility  Bed Mobility                    Transfers Overall transfer level: Modified independent                 General transfer comment: slow rise    Ambulation/Gait Ambulation/Gait assistance: Supervision, Modified independent (Device/Increase time) Gait Distance (Feet): 300 Feet Assistive device: None, Rolling walker (2 wheels) Gait Pattern/deviations: Step-through pattern        General Gait Details: no difficulty; we discussed possible benefits of using RW  Stairs Stairs: Yes Stairs assistance: Supervision Stair Management: One rail Right, Forwards, Alternating pattern, Step to pattern Number of Stairs: 7 General stair comments: took stairs slowly, but no difficultyNo difficutly  Wheelchair Mobility    Modified Rankin (Stroke Patients Only)       Balance                                             Pertinent Vitals/Pain Pain Assessment Pain Assessment: Faces Faces Pain Scale: Hurts little more Pain Location: operative site Pain Descriptors / Indicators: Sore, Operative site guarding Pain Intervention(s): Limited activity within patient's tolerance    Home Living Family/patient expects to be discharged to:: Private residence Living Arrangements: Spouse/significant other Available Help at Discharge: Family Type of Home: House Home Access: Stairs to enter Entrance Stairs-Rails: Right;Left;Can reach both Technical brewer of Steps: 7   Home Layout: One level Home Equipment: None      Prior Function Prior Level of Function : Independent/Modified Independent;Working/employed;Driving             Mobility Comments: no AD ADLs 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, mining        Extremity/Trunk Assessment   Upper Extremity Assessment Upper Extremity Assessment: Defer to OT evaluation    Lower Extremity Assessment Lower Extremity Assessment: Overall WFL for tasks assessed    Cervical /  Trunk Assessment Cervical / Trunk Assessment: Back Surgery  Communication   Communication: No difficulties  Cognition Arousal/Alertness: Awake/alert Behavior During Therapy: WFL for tasks assessed/performed Overall Cognitive Status: Within Functional Limits for tasks assessed                                          General Comments General comments (skin integrity, edema, etc.):  Discussed car transfers    Exercises     Assessment/Plan    PT Assessment Patient does not need any further PT services  PT Problem List         PT Treatment Interventions      PT Goals (Current goals can be found in the Care Plan section)  Acute Rehab PT Goals Patient Stated Goal: HOme today PT Goal Formulation: All assessment and education complete, DC therapy    Frequency       Co-evaluation               AM-PAC PT "6 Clicks" Mobility  Outcome Measure Help needed turning from your back to your side while in a flat bed without using bedrails?: None Help needed moving from lying on your back to sitting on the side of a flat bed without using bedrails?: None Help needed moving to and from a bed to a chair (including a wheelchair)?: None Help needed standing up from a chair using your arms (e.g., wheelchair or bedside chair)?: None Help needed to walk in hospital room?: None Help needed climbing 3-5 steps with a railing? : None 6 Click Score: 24    End of Session   Activity Tolerance: Patient tolerated treatment well Patient left: Other (comment) (managing modified independently in room) Nurse Communication: Mobility status (ok for dc) PT Visit Diagnosis: Other abnormalities of gait and mobility (R26.89)    Time: 3734-2876 PT Time Calculation (min) (ACUTE ONLY): 16 min   Charges:   PT Evaluation $PT Eval Low Complexity: [place], [place] Office [phone]   Colletta Maryland 07/06/2022, 11:28 AM

## 2022-07-06 NOTE — Progress Notes (Signed)
Patient alert and oriented, mae's well, voiding adequate amount of urine, swallowing without difficulty, no c/o pain at time of discharge. Patient discharged home with family. Script and discharged instructions given to patient. Patient and family stated understanding of instructions given. Patient has an appointment with Dr. Beane ?

## 2022-07-10 NOTE — Op Note (Unsigned)
NAME: Jessica Bridges, Jessica Bridges RECORD NO: [id number] ACCOUNT NO: [id number] DATE OF BIRTH: 04/21/1966 FACILITY: [facility] LOCATION: [facility] PHYSICIAN: Johnn Hai, MD  Operative Report   DATE OF PROCEDURE: 07/05/2022  PREOPERATIVE DIAGNOSIS:  Spinal stenosis L4-L5 bilaterally.  POSTOPERATIVE DIAGNOSES:  Spinal stenosis, L4-L5 bilaterally.  PROCEDURE PERFORMED:  Central decompression L4-L5 with bilateral hemilaminotomies and foraminotomies of L4 and L5.  ANESTHESIA:  General.  ASSISTANT:  Lacie Draft, PA.  HISTORY:  A 56 year old with bilateral lower extremity radicular pain and had neural tension signs bilaterally.  Foot drop on the right.  She had severe lateral recess stenosis due to facet hypertrophy, ligamentum flavum hypertrophy and central disk  herniation.  She was indicated for decompression of the L5 nerve roots predominantly. Risks and benefits discussed including bleeding, infection, damage to neurovascular structures, no change in symptoms, worsening symptoms, DVT, PE, anesthetic  complications, etc.  DESCRIPTION OF PROCEDURE:  With the patient in supine position, after induction of adequate general anesthesia, 2 grams Kefzol placed prone on the Wilson frame.  All bony prominences were well padded.  Lumbar region was prepped and draped in the usual  sterile fashion.  Two 18-gauge spinal needles was utilized to localize the L4-L5 interspace.  This was confirmed with x-ray.  Incision was made from the spinous process, 4-5 subcutaneous tissue were dissected with electrocautery utilized to achieve  hemostasis.  Dorsal lumbar fascia divided inline with skin incision.  Paraspinous muscle elevated from lamina 4-5.  McCulloch retractor was placed.  Operating microscope was draped and brought in the surgical field.  Confirmatory radiograph was then  obtained with a Penfield in the interlaminar space at L4-L5.  Leksell rongeur was utilized to remove the partial spinous processes of 4 and 5.   Hemilaminotomies of the caudad edge of L4 was performed bilaterally with a Leksell rongeur.  I used a straight  curette to detach ligamentum flavum from the cephalad edge of L5 bilaterally.  Centrally then, with a Penfield I entered the epidural space developing a plane between the ligamentum flavum and the epidural space and the epidural fat.  Ligamentum flavum  was removed centrally first.  I then performed hemilaminotomies of the caudad edge of 4 with a 2 mm Kerrison to the point detaching the ligamentum flavum.  I then sequentially began removing ligamentum flavum from the interspace bilaterally, left and  right sequentially.  Eventually, decompressing both lateral recesses to the medial border of the pedicle.  There was severe lateral recess decompression bilaterally due to facet ligamentum flavum hypertrophy.  We undercut the superior articulating  process of L5 bilaterally during this process. I identified the disk space bilaterally.  There was a hardened degenerated disk bilaterally.  Calcified.  After performing foraminotomies of L4 and L5, I felt there was excellent restoration of the thecal  sac.  The compression of the 5 roots in the lateral recess bilaterally was removed.  Woodson probe passed freely out the foramen of 4 and 5 above the pedicle 4 and below the pedicle of 5.  No evidence of CSF leakage or active bleeding.  Bone wax was  placed on the cancellous surfaces, copiously irrigated the interspace. Thrombin-soaked Gelfoam was placed in the laminotomy defect and then removed.  Inspection revealed no evidence of CSF leakage or active bleeding.  Good restoration of the thecal sac.   There was also central stenosis noted by ligamentum flavum hypertrophy.  Next, I removed the McCulloch retractor, irrigated the paraspinous musculature, achieved strict hemostasis.  I then  closed the dorsal lumbar fascia with #1 Vicryl in interrupted  figure-of-eight sutures, subcutaneous with 2-0 and skin with  Prolene.  Sterile dressing applied, placed supine on the hospital bed, extubated without difficulty and transported to the recovery room in satisfactory condition.  The patient tolerated the procedure well.  No complications.  Assistant, Lacie Draft, [place], was used throughout the case for patient positioning, intermittent irrigation, gentle intermittent neuro retraction, closure and positioning.  BLOOD LOSS:  25 mL   PUS D: 07/05/2022 12:28:49 pm T: 07/05/2022 2:04:00 pm  JOB: [id number]/ [id number]

## 2022-07-19 ENCOUNTER — Ambulatory Visit (INDEPENDENT_AMBULATORY_CARE_PROVIDER_SITE_OTHER)

## 2022-07-19 DIAGNOSIS — I428 Other cardiomyopathies: Secondary | ICD-10-CM

## 2022-07-20 LAB — ECHOCARDIOGRAM COMPLETE
Area-P 1/2: 5.13 cm2
MV M vel: 3.53 m/s
MV Peak grad: 49.8 mmHg
S' Lateral: 2.81 cm

## 2022-07-31 ENCOUNTER — Encounter: Payer: Self-pay | Admitting: Internal Medicine

## 2022-09-25 ENCOUNTER — Ambulatory Visit (HOSPITAL_BASED_OUTPATIENT_CLINIC_OR_DEPARTMENT_OTHER): Admitting: Cardiology

## 2022-09-25 ENCOUNTER — Encounter (HOSPITAL_BASED_OUTPATIENT_CLINIC_OR_DEPARTMENT_OTHER): Payer: Self-pay | Admitting: Cardiology

## 2022-09-25 VITALS — BP 129/71 | HR 78 | Ht 65.0 in | Wt 148.0 lb

## 2022-09-25 DIAGNOSIS — I428 Other cardiomyopathies: Secondary | ICD-10-CM

## 2022-09-25 DIAGNOSIS — E119 Type 2 diabetes mellitus without complications: Secondary | ICD-10-CM | POA: Diagnosis not present

## 2022-09-25 DIAGNOSIS — Z7189 Other specified counseling: Secondary | ICD-10-CM | POA: Diagnosis not present

## 2022-09-25 DIAGNOSIS — I5181 Takotsubo syndrome: Secondary | ICD-10-CM

## 2022-09-25 NOTE — Patient Instructions (Signed)
Medication Instructions:  Your physician recommends that you continue on your current medications as directed. Please refer to the Current Medication list given to you today.  *If you need a refill on your cardiac medications before your next appointment, please call your pharmacy*  Follow-Up: At [facility], you and your health needs are our priority.  As part of our continuing mission to provide you with exceptional heart care, we have created designated Provider Care Teams.  These Care Teams include your primary Cardiologist (physician) and Advanced Practice Providers (APPs -  Physician Assistants and Nurse Practitioners) who all work together to provide you with the care you need, when you need it.  We recommend signing up for the patient portal called "MyChart".  Sign up information is provided on this After Visit Summary.  MyChart is used to connect with patients for Virtual Visits (Telemedicine).  Patients are able to view lab/test results, encounter notes, upcoming appointments, etc.  Non-urgent messages can be sent to your provider as well.   To learn more about what you can do with MyChart, go to [email or web address].    Your next appointment:   1 year(s)  Provider:   Bridgette Christopher, MD    

## 2022-09-25 NOTE — Progress Notes (Signed)
Cardiology Office Note:    Date:  09/25/2022   ID:  Jessica Bridges, DOB 1965-07-22, MRN [id number]  PCP:  Rebecka Apley, NP  Cardiologist:  Jodelle Red, MD  Referring MD: Rebecka Apley, NP   CC: general cardiology follow up  History of Present Illness:    Jessica Bridges is a 57 y.o. female with a hx of NSTEMI, Takotsubo syndrome, DM, GERD, s/p gastric bypass, anxiety, and depression who is seen for follow up. She has been followed by Dr. Allyson Sabal but requested to transfer care to me for a second opinion.  12/23/21 she presented with NSTEMI.  She had a widely patent left main and patent LAD with mild nonobstructive mid vessel plaque, angiographically normal left circumflex and normal LVEDP (cardiac cath 12/24/2021). Diagnosed with Takotsubo syndrome.  She was seen in the ER on 01/05/22 for chest pain.  EKG was performed with no ischemic changes.  Chest pain started about an hour and a half ago prior to ED visit.  She had a history significant for Takotsubo cardiomyopathy.  At that time she was started on a statin and a beta-blocker.  She did have some mild edema in her legs.  Troponin negative x2.  Chest x-ray without any evidence of pneumonia or pneumothorax.  D-dimer is normal as well as BNP.   She took Metoprolol for 2-3 weeks before stopping it due to worsened chronic fatigue. She wondered if she should try Propanolol given that she had tremors and anxiety. After shared decision making, we planned to trial propranolol. She tried Trulicity with negative GI side effects. She was on [employer] and reported excellent BGL control and weight reduction. She had not tried [place] or [place]. She was given a prescription for 10 mg Jardiance.  Today, the patient states that she has been feeling better lately. She attributes this to Gabapentin (300mg  2x day) which lowered her anxiety after it was prescribed for back pain.  Since her surgery she had been doing pt for exercise to strengthen  herself after recovery.  She has had a lot of stressors recently with her brother and son having difficult times but she is doing well regardless.  She denies any palpitations, chest pain, shortness of breath, or peripheral edema. No lightheadedness, headaches, syncope, orthopnea, or PND.   Past Medical History:  Diagnosis Date   Anemia    Depression    Diabetes mellitus without complication (HCC)    Gastric bypass status for obesity    GERD (gastroesophageal reflux disease)    History of kidney stones    MI (myocardial infarction) (HCC) 12/26/2021   Sleep apnea    CPAP    Past Surgical History:  Procedure Laterality Date   BREAST BIOPSY     right   CARDIAC CATHETERIZATION     CHOLECYSTECTOMY     CORONARY/GRAFT ACUTE MI REVASCULARIZATION N/A 12/24/2021   Procedure: Coronary/Graft Acute MI Revascularization;  Surgeon: Tonny Bollman, MD;  Location: [facility] INVASIVE CV LAB;  Service: Cardiovascular;  Laterality: N/A;   DENTAL RESTORATION/EXTRACTION WITH X-RAY  12/2016   GASTRIC BYPASS     LEFT HEART CATH AND CORONARY ANGIOGRAPHY N/A 12/24/2021   Procedure: LEFT HEART CATH AND CORONARY ANGIOGRAPHY;  Surgeon: Tonny Bollman, MD;  Location: [facility] INVASIVE CV LAB;  Service: Cardiovascular;  Laterality: N/A;   LIVER BIOPSY     LUMBAR LAMINECTOMY/DECOMPRESSION MICRODISCECTOMY N/A 07/05/2022   Procedure: Bilateral Hemilaminotomy microdiscectomy Lumbar four-five;  Surgeon: Jene Every, MD;  Location: [facility] OR;  Service: Orthopedics;  Laterality: N/A;  2 hrs 3 C-Bed   WISDOM TOOTH EXTRACTION      Current Medications: Current Outpatient Medications on File Prior to Visit  Medication Sig   acetaminophen (TYLENOL) 500 MG tablet Take 1,000 mg by mouth every 6 (six) hours as needed for moderate pain.   APLENZIN 522 MG TB24 Take 522 mg by mouth daily.   azelastine (ASTELIN) 0.1 % nasal spray Place 1 spray into both nostrils 2 (two) times daily as needed for allergies or rhinitis.   bismuth  subsalicylate (PEPTO BISMOL) 262 MG/15ML suspension Take 30 mLs by mouth every 4 (four) hours as needed for diarrhea or loose stools or indigestion.   busPIRone (BUSPAR) 10 MG tablet Take 10 mg by mouth 2 (two) times daily.   Calcium 250 MG CAPS Take 250 mg by mouth in the morning and at bedtime.   Cholecalciferol (VITAMIN D PO) Take 5,000 Units by mouth daily.   docusate sodium (COLACE) 100 MG capsule Take 1 capsule (100 mg total) by mouth 2 (two) times daily as needed for mild constipation.   empagliflozin (JARDIANCE) 10 MG TABS tablet Take 1 tablet (10 mg total) by mouth daily before breakfast. (Patient not taking: Reported on 06/26/2022)   Iron TABS Take 1 tablet by mouth daily.   lamoTRIgine (LAMICTAL) 200 MG tablet Take 200 mg by mouth daily.   levocetirizine (XYZAL) 5 MG tablet Take 5 mg by mouth every evening.   lidocaine (LIDODERM) 5 % Place 1 patch onto the skin daily as needed (pain).   lithium 300 MG tablet Take 300 mg by mouth at bedtime.   metFORMIN (GLUCOPHAGE) 1000 MG tablet Take 1,000 mg by mouth 2 (two) times daily.   methocarbamol (ROBAXIN) 500 MG tablet Take 1 tablet (500 mg total) by mouth every 6 (six) hours as needed for muscle spasms.   montelukast (SINGULAIR) 10 MG tablet Take 10 mg by mouth at bedtime.   Multiple Vitamin (MULTIVITAMIN) tablet Take 1 tablet by mouth daily.   omeprazole (PRILOSEC) 40 MG capsule Take 40 mg by mouth daily.    ondansetron (ZOFRAN) 8 MG tablet Take 8 mg by mouth every 8 (eight) hours as needed for nausea or vomiting.   OVER THE COUNTER MEDICATION Take 1 tablet by mouth daily. Medication: Mitomax   oxyCODONE (OXY IR/ROXICODONE) 5 MG immediate release tablet Take 1 tablet (5 mg total) by mouth every 4 (four) hours as needed for severe pain.   polyethylene glycol (MIRALAX / GLYCOLAX) 17 g packet Take 17 g by mouth daily.   QUEtiapine (SEROQUEL) 25 MG tablet Take 25 mg by mouth at bedtime.   QUEtiapine (SEROQUEL) 50 MG tablet Take 50 mg by mouth  at bedtime.   tirzepatide [facility](MOUNJARO) 2.5 MG/0.5ML Pen Inject 2.5 mg into the skin every Sunday.   vortioxetine HBr (TRINTELLIX) 10 MG TABS Take 10 mg by mouth in the morning and at bedtime.   No current facility-administered medications on file prior to visit.     Allergies:   Atorvastatin, Fluvastatin, and Simvastatin   Social History   Tobacco Use   Smoking status: Never   Smokeless tobacco: Never  Substance Use Topics   Alcohol use: No   Drug use: No    Family History: family history is negative for Colon cancer.  ROS:   Please see the history of present illness. All other systems are reviewed and negative.   EKGs/Labs/Other Studies Reviewed:    The following studies were reviewed today: Echo 07/20/22:  1.  Left ventricular ejection fraction, by estimation, is 60 to 65%. Left  ventricular ejection fraction by 3D volume is 62 %. The left ventricle has  normal function. The left ventricle has no regional wall motion  abnormalities. Left ventricular diastolic   parameters were normal.   2. Right ventricular systolic function is normal. The right ventricular  size is normal. Tricuspid regurgitation signal is inadequate for assessing  PA pressure.   3. The mitral valve is degenerative. Trivial mitral valve regurgitation.  No evidence of mitral stenosis.   4. The aortic valve is normal in structure. Aortic valve regurgitation is  not visualized. Aortic valve sclerosis is present, with no evidence of  aortic valve stenosis.   5. The inferior vena cava is normal in size with greater than 50%  respiratory variability, suggesting right atrial pressure of 3 mmHg.   Cath 12/24/21: 1. Widely patent left main 2. Patent LAD with mild nonobstructive mid vessel plaquing 3. Angiographically normal left circumflex 4. Normal LVEDP Recommendations: The patient has mild nonobstructive coronary artery disease. I suspect she is having a non-coronary event such as atypical stress-induced  event like acute Takotsubo syndrome. I reviewed her echo and her wall motion abnormality is focal involving the mid-septum. It does not appear to follow any typical coronary distribution. Recommend medical therapy.   Echo 12/24/21: 1. Akinesis of the midseptum with overall mild LV dysfunction. Left ventricular ejection fraction, by estimation, is 45 to 50%. The left ventricle has mildly decreased function. The left ventricle demonstrates regional wall motion abnormalities (see scoring diagram/findings for description). Left ventricular diastolic parameters are consistent with Grade I diastolic dysfunction (impaired relaxation). 2.Right ventricular systolic function is normal. The right ventricular size is normal. Tricuspid regurgitation signal is inadequate for assessing PA pressure. 3.The mitral valve is normal in structure. Trivial mitral valve regurgitation. No evidence of mitral stenosis. 4.The aortic valve is tricuspid. Aortic valve regurgitation is not visualized. No aortic stenosis is present. 5.The inferior vena cava is normal in size with greater than 50% respiratory variability, suggesting right atrial pressure of 3 mmHg.  EKG:  EKG was personally reviewed.  09/25/2022: not ordered today 06/26/2022:  NSR at 88 bpm 05/18/2022:  NSR at 83 bpm  Recent Labs: 01/05/2022: B Natriuretic Peptide 75.1 03/02/2022: ALT 26 07/03/2022: BUN 13; Creatinine, Ser 0.95; Hemoglobin 12.1; Platelets 273; Potassium 3.9; Sodium 135   Recent Lipid Panel    Component Value Date/Time   CHOL 117 02/21/2022 1617   TRIG 78 02/21/2022 1617   HDL 78 02/21/2022 1617   CHOLHDL 1.5 02/21/2022 1617   CHOLHDL 2.0 12/24/2021 0537   VLDL 24 12/24/2021 0537   LDLCALC 23 02/21/2022 1617    Physical Exam:    VS:  BP 129/71   Pulse 78   Ht 5\' 5"  (1.651 m)   Wt 148 lb (67.1 kg)   BMI 24.63 kg/m     Wt Readings from Last 3 Encounters:  09/25/22 148 lb (67.1 kg)  07/05/22 142 lb (64.4 kg)  07/03/22 142 lb  14.4 oz (64.8 kg)    GEN: Well nourished, well developed in no acute distress HEENT: Normal, moist mucous membranes NECK: No JVD CARDIAC: regular rhythm, normal S1 and S2, no rubs or gallops. No murmur. VASCULAR: Radial and DP pulses 2+ bilaterally. No carotid bruits RESPIRATORY:  Clear to auscultation without rales, wheezing or rhonchi  ABDOMEN: Soft, non-tender, non-distended MUSCULOSKELETAL:  Ambulates independently SKIN: Warm and dry, no edema NEUROLOGIC:  Alert and oriented x 3. No  focal neuro deficits noted. PSYCHIATRIC:  Normal affect    ASSESSMENT:    1. Stress-induced cardiomyopathy   2. NICM (nonischemic cardiomyopathy) (HCC)   3. Type 2 diabetes mellitus without complication, without long-term current use of insulin (HCC)   4. Cardiac risk counseling   5. Counseling on health promotion and disease prevention      PLAN:    Nonischemic cardiomyopathy, likely stress induced History of NSTEMI without CAD -did not tolerate metoprolol due to fatigue. We have discussed other beta blockers, as well as ACEi/ARB/ARNI and SGLT2i -repeat echo 07/2022 shows normalization of EF to 60-65%  Type II diabetes -with NICM, discussed GLP1RA and SGLT2i. Currently on tirzepatide (GLP1/GIP RA) -previously on aspirin and rosuvastatin, cath with nonobstructive disease. I recommend continuing aspirin and statin given diabetes. As she is feeling well, will not add meds at this time, readdress at follow up  Cardiac risk counseling and prevention recommendations: -recommend heart healthy/Mediterranean diet, with whole grains, fruits, vegetable, fish, lean meats, nuts, and olive oil. Limit salt. -recommend moderate walking, 3-5 times/week for 30-50 minutes each session. Aim for at least 150 minutes.week. Goal should be pace of 3 miles/hours, or walking 1.5 miles in 30 minutes -recommend avoidance of tobacco products. Avoid excess alcohol.  Plan for follow up: 1 year or PRN  Jodelle Red, MD, PhD, [facility]  [facility]    Medication Adjustments/Labs and Tests Ordered: Current medicines are reviewed at length with the patient today.  Concerns regarding medicines are outlined above.   No orders of the defined types were placed in this encounter.  No orders of the defined types were placed in this encounter.  Patient Instructions  Medication Instructions:  Your physician recommends that you continue on your current medications as directed. Please refer to the Current Medication list given to you today.  *If you need a refill on your cardiac medications before your next appointment, please call your pharmacy*  Follow-Up: At [facility], you and your health needs are our priority.  As part of our continuing mission to provide you with exceptional heart care, we have created designated Provider Care Teams.  These Care Teams include your primary Cardiologist (physician) and Advanced Practice Providers (APPs -  Physician Assistants and Nurse Practitioners) who all work together to provide you with the care you need, when you need it.  We recommend signing up for the patient portal called "MyChart".  Sign up information is provided on this After Visit Summary.  MyChart is used to connect with patients for Virtual Visits (Telemedicine).  Patients are able to view lab/test results, encounter notes, upcoming appointments, etc.  Non-urgent messages can be sent to your provider as well.   To learn more about what you can do with MyChart, go to [email or web address].    Your next appointment:   1 year(s)  Provider:   Jodelle Red, MD      I,Coren O'Brien,acting as a scribe for Jodelle Red, MD.,have documented all relevant documentation on the behalf of Jodelle Red, MD,as directed by  Jodelle Red, MD while in the presence of Jodelle Red, MD.  I, Jodelle Red, MD, have reviewed all  documentation for this visit. The documentation on 10/21/22 for the exam, diagnosis, procedures, and orders are all accurate and complete.

## 2023-07-30 ENCOUNTER — Ambulatory Visit (AMBULATORY_SURGERY_CENTER)

## 2023-07-30 VITALS — Ht 65.0 in | Wt 140.0 lb

## 2023-07-30 DIAGNOSIS — Z860100 Personal history of colon polyps, unspecified: Secondary | ICD-10-CM

## 2023-07-30 MED ORDER — PLENVU 140 G PO SOLR: 1.0000 | Freq: Once | ORAL | 0 refills | Status: AC

## 2023-07-30 NOTE — Progress Notes (Signed)
 No egg or soy allergy known to patient  No issues known to pt with past sedation with any surgeries or procedures Patient denies ever being told they had issues or difficulty with intubation  No FH of Malignant Hyperthermia Pt is not on diet pills Pt is not on  home 02  OSA not currently having to use CPAP  Pt is not on blood thinners  Pt reports occ constipation taking OTC stool softener  No A fib or A flutter Have any cardiac testing pending-- no  LOA: independent  Prep: plenvu    Patient's chart reviewed by Norleen Schillings CNRA prior to previsit and patient appropriate for the [facility].  Previsit completed and red dot placed by patient's name on their procedure day (on provider's schedule).     PV competed with patient. Prep instructions sent via mychart and home address.

## 2023-08-09 IMAGING — DX DG CHEST 1V PORT
1 series · 1 of 1 positions shown · non-contrast
Comparison: Chest radiograph dated 12/22/2012.

CLINICAL DATA: Chest pain.

EXAM:
PORTABLE CHEST 1 VIEW

[chest ap]
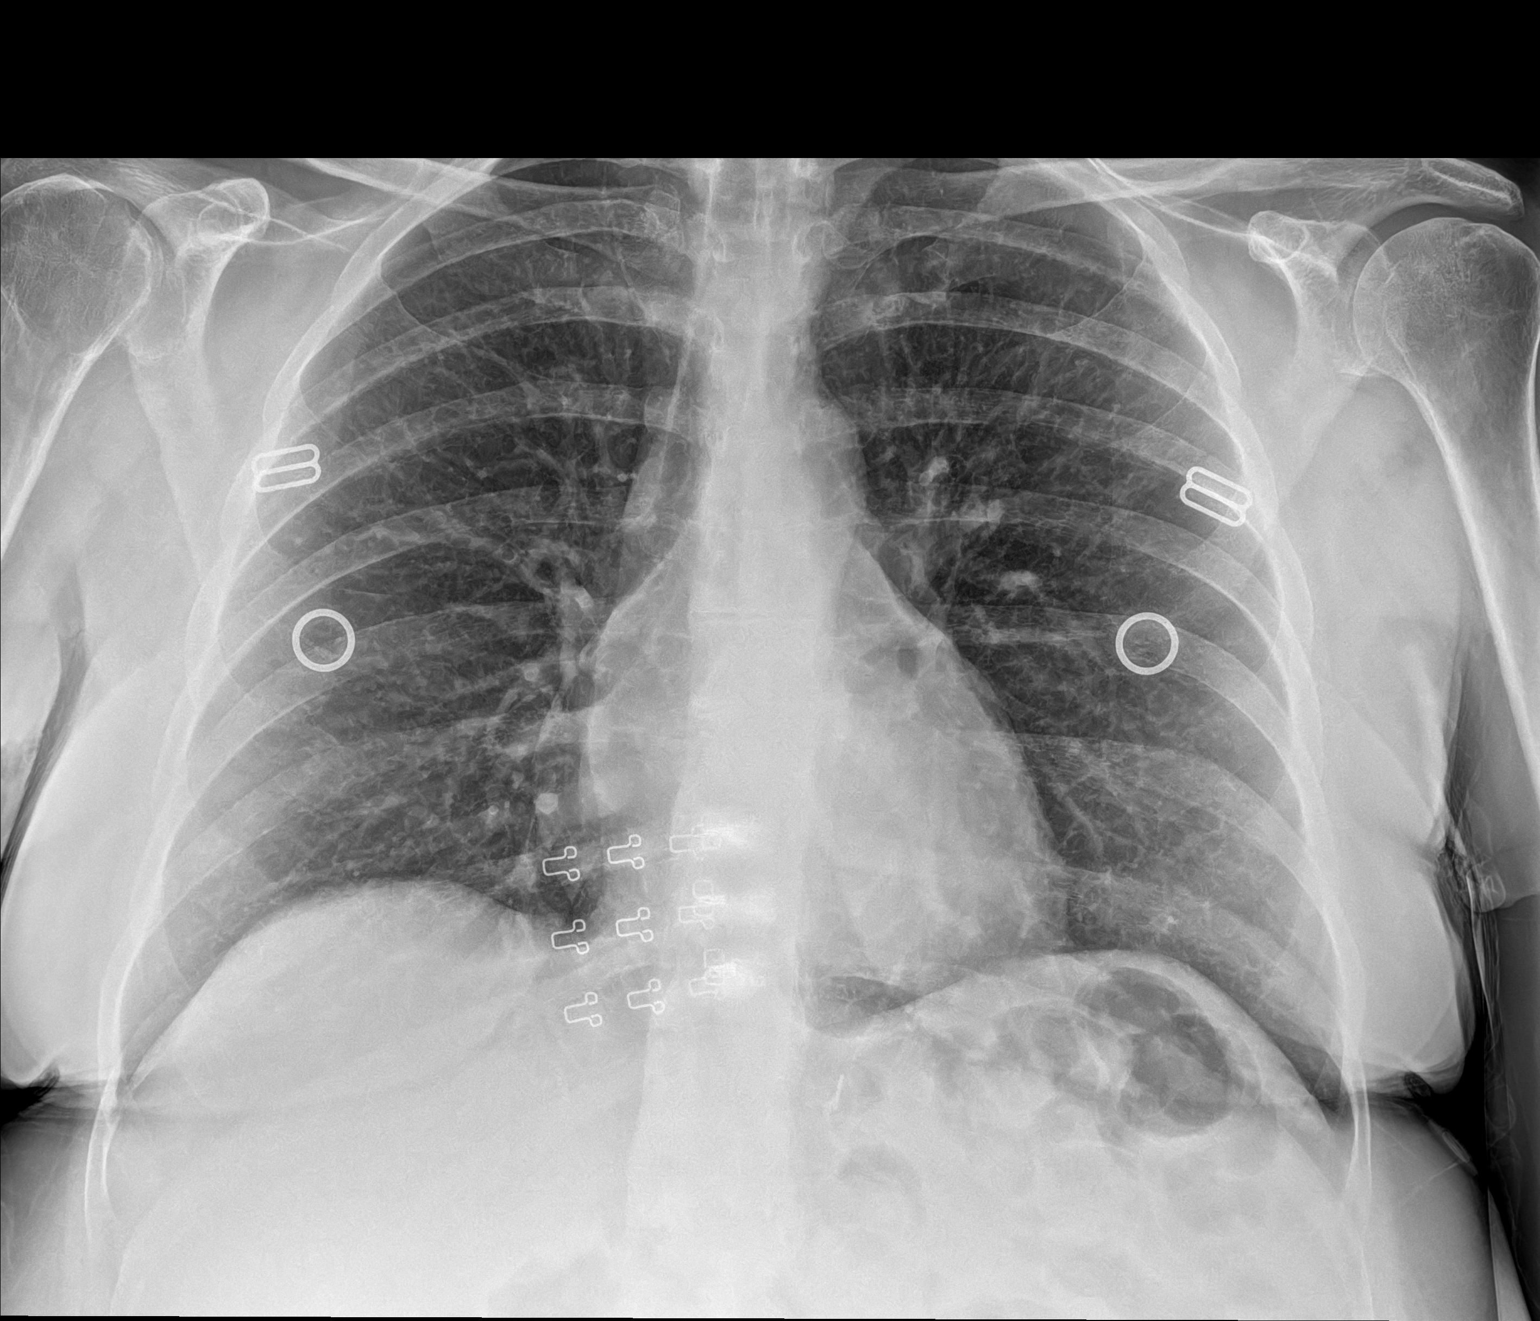

[1 of 1 positions shown; findings below may reference images not displayed]

FINDINGS: No focal consolidation, pleural effusion, pneumothorax. The cardiac
silhouette is within normal limits. No acute osseous pathology.
IMPRESSION: No active disease.

## 2023-09-06 ENCOUNTER — Encounter: Payer: Self-pay | Admitting: Internal Medicine

## 2023-09-06 ENCOUNTER — Ambulatory Visit (AMBULATORY_SURGERY_CENTER): Admitting: Internal Medicine

## 2023-09-06 VITALS — BP 135/84 | HR 84 | Temp 97.7°F | Resp 10 | Ht 65.0 in | Wt 140.0 lb

## 2023-09-06 DIAGNOSIS — Z860101 Personal history of adenomatous and serrated colon polyps: Secondary | ICD-10-CM | POA: Diagnosis not present

## 2023-09-06 DIAGNOSIS — Z1211 Encounter for screening for malignant neoplasm of colon: Secondary | ICD-10-CM | POA: Diagnosis present

## 2023-09-06 DIAGNOSIS — K5730 Diverticulosis of large intestine without perforation or abscess without bleeding: Secondary | ICD-10-CM

## 2023-09-06 DIAGNOSIS — Z860100 Personal history of colon polyps, unspecified: Secondary | ICD-10-CM

## 2023-09-06 MED ORDER — SODIUM CHLORIDE 0.9 % IV SOLN: 500.0000 mL | Freq: Once | INTRAVENOUS | Status: DC

## 2023-09-06 NOTE — Progress Notes (Signed)
 GASTROENTEROLOGY PROCEDURE H&P NOTE   Primary Care Physician: Rebecka Apley, NP    Reason for Procedure:  History of nonadvanced adenomatous colon polyp  Plan:    Colonoscopy  Patient is appropriate for endoscopic procedure(s) in the ambulatory ([facility]) setting.  The nature of the procedure, as well as the risks, benefits, and alternatives were carefully and thoroughly reviewed with the patient. Ample time for discussion and questions allowed. The patient understood, was satisfied, and agreed to proceed.     HPI: Jessica Bridges is a 58 y.o. female who presents for surveillance colonoscopy.  Medical history as below.  Tolerated the prep.  No recent chest pain or shortness of breath.  No abdominal pain today.  Past Medical History:  Diagnosis Date   Anemia    Depression    Diabetes mellitus without complication (HCC)    Gastric bypass status for obesity    GERD (gastroesophageal reflux disease)    History of kidney stones    MI (myocardial infarction) (HCC) 12/26/2021   Sleep apnea    CPAP    Past Surgical History:  Procedure Laterality Date   BREAST BIOPSY     right   CARDIAC CATHETERIZATION     CHOLECYSTECTOMY     CORONARY/GRAFT ACUTE MI REVASCULARIZATION N/A 12/24/2021   Procedure: Coronary/Graft Acute MI Revascularization;  Surgeon: Tonny Bollman, MD;  Location: [facility] INVASIVE CV LAB;  Service: Cardiovascular;  Laterality: N/A;   DENTAL RESTORATION/EXTRACTION WITH X-RAY  12/2016   GASTRIC BYPASS     LEFT HEART CATH AND CORONARY ANGIOGRAPHY N/A 12/24/2021   Procedure: LEFT HEART CATH AND CORONARY ANGIOGRAPHY;  Surgeon: Tonny Bollman, MD;  Location: [facility] INVASIVE CV LAB;  Service: Cardiovascular;  Laterality: N/A;   LIVER BIOPSY     LUMBAR LAMINECTOMY/DECOMPRESSION MICRODISCECTOMY N/A 07/05/2022   Procedure: Bilateral Hemilaminotomy microdiscectomy Lumbar four-five;  Surgeon: Jene Every, MD;  Location: [facility] OR;  Service: Orthopedics;  Laterality: N/A;  2 hrs 3  C-Bed   WISDOM TOOTH EXTRACTION      Prior to Admission medications   Medication Sig Start Date End Date Taking? Authorizing Provider  acetaminophen (TYLENOL) 500 MG tablet Take 1,000 mg by mouth every 6 (six) hours as needed for moderate pain.   Yes [provider]  APLENZIN 522 MG TB24 Take 522 mg by mouth daily. 12/20/21  Yes [provider]  atomoxetine (STRATTERA) 40 MG capsule Take 40 mg by mouth every morning.   Yes [provider]  busPIRone (BUSPAR) 10 MG tablet Take 10 mg by mouth 2 (two) times daily. 01/17/22  Yes [provider]  Cholecalciferol (VITAMIN D PO) Take 5,000 Units by mouth daily.   Yes [provider]  gabapentin (NEURONTIN) 300 MG capsule Take 300 mg by mouth 2 (two) times daily.   Yes [provider]  lamoTRIgine (LAMICTAL) 200 MG tablet Take 200 mg by mouth daily. 01/17/22  Yes [provider]  metFORMIN (GLUCOPHAGE) 1000 MG tablet Take 1,000 mg by mouth 2 (two) times daily. 04/24/22  Yes [provider]  montelukast (SINGULAIR) 10 MG tablet Take 10 mg by mouth at bedtime.   Yes [provider]  omeprazole (PRILOSEC) 40 MG capsule Take 40 mg by mouth daily.    Yes [provider]  ondansetron (ZOFRAN) 8 MG tablet Take 8 mg by mouth every 8 (eight) hours as needed for nausea or vomiting.   Yes [provider]  QUEtiapine (SEROQUEL) 25 MG tablet Take 75 mg by mouth at  bedtime.   Yes [provider]  vortioxetine HBr (TRINTELLIX) 10 MG TABS Take 10 mg by mouth in the morning and at bedtime.   Yes [provider]  azelastine (ASTELIN) 0.1 % nasal spray Place 1 spray into both nostrils 2 (two) times daily as needed for allergies or rhinitis.    [provider]  bismuth subsalicylate (PEPTO BISMOL) 262 MG/15ML suspension Take 30 mLs by mouth every 4 (four) hours as needed for diarrhea or loose stools or indigestion. Patient not taking: Reported on  07/30/2023    [provider]  Calcium 250 MG CAPS Take 250 mg by mouth daily.    [provider]  docusate sodium (COLACE) 100 MG capsule Take 1 capsule (100 mg total) by mouth 2 (two) times daily as needed for mild constipation. 07/05/22   Jene Every, MD  fluconazole (DIFLUCAN) 150 MG tablet Take 150 mg by mouth as needed. Patient not taking: Reported on 09/06/2023 07/01/23   [provider]  Iron TABS Take 1 tablet by mouth daily.    [provider]  JARDIANCE 10 MG TABS tablet Take 5 mg by mouth daily. Patient not taking: Reported on 09/06/2023    [provider]  levocetirizine (XYZAL) 5 MG tablet Take 5 mg by mouth every evening.    [provider]  lidocaine (LIDODERM) 5 % Place 1 patch onto the skin daily as needed (pain). 04/24/22   [provider]  Multiple Vitamin (MULTIVITAMIN) tablet Take 1 tablet by mouth daily.    [provider]    Current Outpatient Medications  Medication Sig Dispense Refill   acetaminophen (TYLENOL) 500 MG tablet Take 1,000 mg by mouth every 6 (six) hours as needed for moderate pain.     APLENZIN 522 MG TB24 Take 522 mg by mouth daily.     atomoxetine (STRATTERA) 40 MG capsule Take 40 mg by mouth every morning.     busPIRone (BUSPAR) 10 MG tablet Take 10 mg by mouth 2 (two) times daily.     Cholecalciferol (VITAMIN D PO) Take 5,000 Units by mouth daily.     gabapentin (NEURONTIN) 300 MG capsule Take 300 mg by mouth 2 (two) times daily.     lamoTRIgine (LAMICTAL) 200 MG tablet Take 200 mg by mouth daily.     metFORMIN (GLUCOPHAGE) 1000 MG tablet Take 1,000 mg by mouth 2 (two) times daily.     montelukast (SINGULAIR) 10 MG tablet Take 10 mg by mouth at bedtime.     omeprazole (PRILOSEC) 40 MG capsule Take 40 mg by mouth daily.      ondansetron (ZOFRAN) 8 MG tablet Take 8 mg by mouth every 8 (eight) hours as needed for nausea or vomiting.     QUEtiapine (SEROQUEL) 25 MG tablet Take 75  mg by mouth at bedtime.     vortioxetine HBr (TRINTELLIX) 10 MG TABS Take 10 mg by mouth in the morning and at bedtime.     azelastine (ASTELIN) 0.1 % nasal spray Place 1 spray into both nostrils 2 (two) times daily as needed for allergies or rhinitis.     bismuth subsalicylate (PEPTO BISMOL) 262 MG/15ML suspension Take 30 mLs by mouth every 4 (four) hours as needed for diarrhea or loose stools or indigestion. (Patient not taking: Reported on 07/30/2023)     Calcium 250 MG CAPS Take 250 mg by mouth daily.     docusate sodium (COLACE) 100 MG capsule Take 1 capsule (100 mg total) by mouth 2 (  two) times daily as needed for mild constipation. 30 capsule 1   fluconazole (DIFLUCAN) 150 MG tablet Take 150 mg by mouth as needed. (Patient not taking: Reported on 09/06/2023)     Iron TABS Take 1 tablet by mouth daily.     JARDIANCE 10 MG TABS tablet Take 5 mg by mouth daily. (Patient not taking: Reported on 09/06/2023)     levocetirizine (XYZAL) 5 MG tablet Take 5 mg by mouth every evening.     lidocaine (LIDODERM) 5 % Place 1 patch onto the skin daily as needed (pain).     Multiple Vitamin (MULTIVITAMIN) tablet Take 1 tablet by mouth daily.     Current Facility-Administered Medications  Medication Dose Route Frequency Provider Last Rate Last Admin   0.9 %  sodium chloride infusion  500 mL Intravenous Once Gleb Mcguire, Carie Caddy, MD        Allergies as of 09/06/2023 - Review Complete 09/06/2023  Allergen Reaction Noted   Atorvastatin Other (See Comments) 07/15/2003   Fluvastatin Other (See Comments) 10/13/2002   Simvastatin Other (See Comments) 10/13/2002   Rosuvastatin Other (See Comments) 09/14/2022    Family History  Problem Relation Age of Onset   Colon cancer Neg Hx    Rectal cancer Neg Hx    Stomach cancer Neg Hx     Social History   Socioeconomic History   Marital status: Married    Spouse name: Not on file   Number of children: Not on file   Years of education: Not on file   Highest  education level: Not on file  Occupational History   Not on file  Tobacco Use   Smoking status: Never   Smokeless tobacco: Never  Substance and Sexual Activity   Alcohol use: No   Drug use: No   Sexual activity: Not on file  Other Topics Concern   Not on file  Social History Narrative   Not on file   Social Drivers of Health   Financial Resource Strain: Low Risk  (07/22/2022)   Received from [facility], [facility]   Overall Financial Resource Strain (CARDIA)    Difficulty of Paying Living Expenses: Not hard at all  Food Insecurity: No Food Insecurity (07/22/2022)   Received from [facility], [facility]   Hunger Vital Sign    Worried About Running Out of Food in the Last Year: Never true    Ran Out of Food in the Last Year: Never true  Transportation Needs: No Transportation Needs (07/22/2022)   Received from [employer], [facility]   PRAPARE - Transportation    Lack of Transportation (Medical): No    Lack of Transportation (Non-Medical): No  Physical Activity: Unknown (07/22/2022)   Received from [facility]   Exercise Vital Sign    Days of Exercise per Week: 0 days    Minutes of Exercise per Session: Not on file  Recent Concern: Physical Activity - Inactive (07/22/2022)   Received from [facility]   Exercise Vital Sign    Days of Exercise per Week: 0 days    Minutes of Exercise per Session: 20 min  Stress: No Stress Concern Present (07/22/2022)   Received from [facility], [facility] - Occupational Stress Questionnaire    Feeling of Stress : Not at all  Social Connections: Socially Integrated (07/22/2022)   Received from [facility], [facility]   Social Network    How would you rate your social network (family,  work, friends)?: Good participation with social networks  Intimate Partner Violence: Not At Risk (07/22/2022)   Received from [facility], [facility]   HITS    Over the last 12 months how often  did your partner physically hurt you?: Never    Over the last 12 months how often did your partner insult you or talk down to you?: Rarely    Over the last 12 months how often did your partner threaten you with physical harm?: Never    Over the last 12 months how often did your partner scream or curse at you?: Rarely    Physical Exam: Vital signs in last 24 hours: @BP  117/62   Pulse 92   Temp 97.7 F (36.5 C)   Ht 5\' 5"  (1.651 m)   Wt 140 lb (63.5 kg)   BMI 23.30 kg/m  GEN: NAD EYE: Sclerae anicteric ENT: MMM CV: Non-tachycardic Pulm: CTA b/l GI: Soft, NT/ND NEURO:  Alert & Oriented x 3   Erick Blinks, MD  Gastroenterology  09/06/2023 1:38 PM

## 2023-09-06 NOTE — Progress Notes (Signed)
 Pt's states no medical or surgical changes since previsit or office visit.

## 2023-09-06 NOTE — Op Note (Signed)
 [facility] Patient Name: Jessica Bridges Procedure Date: 09/06/2023 1:08 PM MRN: [id number] Endoscopist: Beverley Fiedler , MD, [id number] Age: 58 Referring MD:  Date of Birth: 1966-06-06 Gender: Female Account #: [id number] Procedure:                Colonoscopy Indications:              High risk colon cancer surveillance: Personal                            history of non-advanced adenoma, Last colonoscopy:                            November 2018 (TA x 1) Medicines:                Monitored Anesthesia Care Procedure:                Pre-Anesthesia Assessment:                           - Prior to the procedure, a History and Physical                            was performed, and patient medications and                            allergies were reviewed. The patient's tolerance of                            previous anesthesia was also reviewed. The risks                            and benefits of the procedure and the sedation                            options and risks were discussed with the patient.                            All questions were answered, and informed consent                            was obtained. Prior Anticoagulants: The patient has                            taken no anticoagulant or antiplatelet agents. ASA                            Grade Assessment: II - A patient with mild systemic                            disease. After reviewing the risks and benefits,                            the patient was deemed in satisfactory condition to  undergo the procedure.                           After obtaining informed consent, the colonoscope                            was passed under direct vision. Throughout the                            procedure, the patient's blood pressure, pulse, and                            oxygen saturations were monitored continuously. The                            Olympus Scope SN: [id number] was introduced  through                            the anus and advanced to the cecum, identified by                            appendiceal orifice and ileocecal valve. The                            colonoscopy was performed without difficulty. The                            patient tolerated the procedure well. The quality                            of the bowel preparation was good. The ileocecal                            valve, appendiceal orifice, and rectum were                            photographed. Scope In: 1:49:15 PM Scope Out: 2:09:03 PM Scope Withdrawal Time: 0 hours 15 minutes 19 seconds  Total Procedure Duration: 0 hours 19 minutes 48 seconds  Findings:                 The digital rectal exam was normal.                           Multiple small-mouthed diverticula were found in                            the sigmoid colon and descending colon.                           The exam was otherwise without abnormality on                            direct and retroflexion views. Complications:            No immediate complications. Estimated Blood  Loss:     Estimated blood loss: none. Impression:               - Mild diverticulosis in the sigmoid colon and in                            the descending colon.                           - The examination was otherwise normal on direct                            and retroflexion views.                           - No specimens collected. Recommendation:           - Patient has a contact number available for                            emergencies. The signs and symptoms of potential                            delayed complications were discussed with the                            patient. Return to normal activities tomorrow.                            Written discharge instructions were provided to the                            patient.                           - Resume previous diet.                           - Continue present medications.                            - Repeat colonoscopy in 10 years for surveillance. Beverley Fiedler, MD 09/06/2023 2:10:54 PM This report has been signed electronically.

## 2023-09-06 NOTE — Patient Instructions (Signed)
 Resume previous diet and medications.  Diverticulosis handout provided.  Repeat colonoscopy in 10 years for surveillance.    YOU HAD AN ENDOSCOPIC PROCEDURE TODAY AT THE [facility]:   Refer to the procedure report that was given to you for any specific questions about what was found during the examination.  If the procedure report does not answer your questions, please call your gastroenterologist to clarify.  If you requested that your care partner not be given the details of your procedure findings, then the procedure report has been included in a sealed envelope for you to review at your convenience later.  YOU SHOULD EXPECT: Some feelings of bloating in the abdomen. Passage of more gas than usual.  Walking can help get rid of the air that was put into your GI tract during the procedure and reduce the bloating. If you had a lower endoscopy (such as a colonoscopy or flexible sigmoidoscopy) you may notice spotting of blood in your stool or on the toilet paper. If you underwent a bowel prep for your procedure, you may not have a normal bowel movement for a few days.  Please Note:  You might notice some irritation and congestion in your nose or some drainage.  This is from the oxygen used during your procedure.  There is no need for concern and it should clear up in a day or so.  SYMPTOMS TO REPORT IMMEDIATELY:  Following lower endoscopy (colonoscopy or flexible sigmoidoscopy):  Excessive amounts of blood in the stool  Significant tenderness or worsening of abdominal pains  Swelling of the abdomen that is new, acute  Fever of 100F or higher  For urgent or emergent issues, a gastroenterologist can be reached at any hour by calling [phone]. Do not use MyChart messaging for urgent concerns.    DIET:  We do recommend a small meal at first, but then you may proceed to your regular diet.  Drink plenty of fluids but you should avoid alcoholic beverages for 24 hours.  ACTIVITY:   You should plan to take it easy for the rest of today and you should NOT DRIVE or use heavy machinery until tomorrow (because of the sedation medicines used during the test).    FOLLOW UP: Our staff will call the number listed on your records the next business day following your procedure.  We will call around 7:15- 8:00 am to check on you and address any questions or concerns that you may have regarding the information given to you following your procedure. If we do not reach you, we will leave a message.     If any biopsies were taken you will be contacted by phone or by letter within the next 1-3 weeks.  Please call us at [phone] if you have not heard about the biopsies in 3 weeks.    SIGNATURES/CONFIDENTIALITY: You and/or your care partner have signed paperwork which will be entered into your electronic medical record.  These signatures attest to the fact that that the information above on your After Visit Summary has been reviewed and is understood.  Full responsibility of the confidentiality of this discharge information lies with you and/or your care-partner.

## 2023-09-06 NOTE — Progress Notes (Signed)
 Vss nad trans to pacu

## 2023-09-09 ENCOUNTER — Telehealth: Payer: Self-pay | Admitting: *Deleted

## 2023-09-09 NOTE — Telephone Encounter (Signed)
 Attempted post procedure follow up call.  No answer - LVM.

## 2024-05-03 ENCOUNTER — Other Ambulatory Visit: Payer: Self-pay

## 2024-05-03 ENCOUNTER — Emergency Department (HOSPITAL_COMMUNITY)

## 2024-05-03 ENCOUNTER — Emergency Department (HOSPITAL_COMMUNITY)
Admission: EM | Admit: 2024-05-03 | Discharge: 2024-05-03 | Discharge status: Against Medical Advice | Attending: Emergency Medicine | Admitting: Emergency Medicine

## 2024-05-03 ENCOUNTER — Encounter (HOSPITAL_COMMUNITY): Payer: Self-pay | Admitting: *Deleted

## 2024-05-03 DIAGNOSIS — R1013 Epigastric pain: Secondary | ICD-10-CM | POA: Diagnosis present

## 2024-05-03 DIAGNOSIS — R110 Nausea: Secondary | ICD-10-CM | POA: Diagnosis not present

## 2024-05-03 DIAGNOSIS — Z5321 Procedure and treatment not carried out due to patient leaving prior to being seen by health care provider: Secondary | ICD-10-CM | POA: Diagnosis not present

## 2024-05-03 LAB — HEPATIC FUNCTION PANEL
ALT: 25 U/L (ref 0–44)
AST: 23 U/L (ref 15–41)
Albumin: 3.4 g/dL — ABNORMAL LOW (ref 3.5–5.0)
Alkaline Phosphatase: 78 U/L (ref 38–126)
Bilirubin, Direct: 0.1 mg/dL (ref 0.0–0.2)
Indirect Bilirubin: 0.3 mg/dL (ref 0.3–0.9)
Total Bilirubin: 0.4 mg/dL (ref 0.0–1.2)
Total Protein: 6 g/dL — ABNORMAL LOW (ref 6.5–8.1)

## 2024-05-03 LAB — CBC
HCT: 30.5 % — ABNORMAL LOW (ref 36.0–46.0)
Hemoglobin: 10.2 g/dL — ABNORMAL LOW (ref 12.0–15.0)
MCH: 29.7 pg (ref 26.0–34.0)
MCHC: 33.4 g/dL (ref 30.0–36.0)
MCV: 88.7 fL (ref 80.0–100.0)
Platelets: 265 K/uL (ref 150–400)
RBC: 3.44 MIL/uL — ABNORMAL LOW (ref 3.87–5.11)
RDW: 12.7 % (ref 11.5–15.5)
WBC: 7.7 K/uL (ref 4.0–10.5)
nRBC: 0.3 % — ABNORMAL HIGH (ref 0.0–0.2)

## 2024-05-03 LAB — LIPASE, BLOOD: Lipase: 28 U/L (ref 11–51)

## 2024-05-03 LAB — TROPONIN I (HIGH SENSITIVITY)
Troponin I (High Sensitivity): 3 ng/L (ref <18)
Troponin I (High Sensitivity): 3 ng/L (ref <18)

## 2024-05-03 LAB — BASIC METABOLIC PANEL WITH GFR
Anion gap: 10 (ref 5–15)
BUN: 14 mg/dL (ref 6–20)
CO2: 23 mmol/L (ref 22–32)
Calcium: 8.9 mg/dL (ref 8.9–10.3)
Chloride: 96 mmol/L — ABNORMAL LOW (ref 98–111)
Creatinine, Ser: 0.86 mg/dL (ref 0.44–1.00)
GFR, Estimated: >60 mL/min (ref >60)
Glucose, Bld: 203 mg/dL — ABNORMAL HIGH (ref 70–99)
Potassium: 4 mmol/L (ref 3.5–5.1)
Sodium: 129 mmol/L — ABNORMAL LOW (ref 135–145)

## 2024-05-03 NOTE — ED Triage Notes (Signed)
 Pt with hx of NSTEMI is here by [facility] for burning pain in epigastric area and into chest with radiaition to back.  Pt is on omeprazole.  No SOB. Pt had 324mg  aspirin  and one sl nitroglycerin . 20g in left hand and was given 450ml of NS by ems

## 2024-05-03 NOTE — ED Provider Triage Note (Signed)
 Emergency Medicine Provider Triage Evaluation Note  Jessica Bridges , a 58 y.o. female  was evaluated in triage.  Pt complains of epigastric pain radiating into her throat.  States it feels like burning.  Does have hx of GERD.  Some nausea but no vomiting.  Zofran  given with EMS which has helped.  Review of Systems  Positive: Epigastric/chest pain Negative: fever  Physical Exam  BP 113/69 (BP Location: Right Arm)   Pulse 92   Temp (!) 97.5 F (36.4 C) (Oral)   Resp 16   SpO2 100%  Gen:   Awake, no distress   Resp:  Normal effort  MSK:   Moves extremities without difficulty  Other:    Medical Decision Making  Medically screening exam initiated at 3:17 AM.  Appropriate orders placed.  Jessica Bridges was informed that the remainder of the evaluation will be completed by another provider, this initial triage assessment does not replace that evaluation, and the importance of remaining in the ED until their evaluation is complete.  Epigastric/chest pain, burning in nature.  Hx GERD.  EKG, labs, CXR.   Jarold Olam HERO, PA-C 05/03/24 0319

## 2024-05-03 NOTE — ED Notes (Signed)
 Pt's husband notified this NT that they are leaving and that pt is fed up with the wait. That they will follow up with the cardiologist Monday. Pt's IV removed and pt seen leaving with husband.

## 2024-05-04 ENCOUNTER — Telehealth (HOSPITAL_BASED_OUTPATIENT_CLINIC_OR_DEPARTMENT_OTHER): Payer: Self-pay | Admitting: Cardiology

## 2024-05-04 NOTE — Telephone Encounter (Signed)
 Husband Sherl) stated patient went to ED on Saturday evening and had elevated troponin levels.  Husband wants to know if Dr. Lonni can review patient's note and call patient directly to advise on next steps.  Patient has appointment scheduled on 12/19.

## 2024-05-04 NOTE — Telephone Encounter (Signed)
 Spoke with patient and husband, troponin level of 3 given to patient  Patient felt pain heartburn, ate something after home and it hurt some  Other than that no further pain  Offered patient appointment tomorrow to see Reche ORN NP however they would like to wait until the December appointment and just be seen then  Will forward Dr Lonni for review

## 2024-05-05 NOTE — Telephone Encounter (Signed)
 Jessica Slain, MD to Me    05/04/24  7:25 PM Not seen by ER provider, only seen in triage. hsTn normal x2. Suggests against cardiac etiology but agree with sooner follow up if they are amenable.  Spoke with patients husband and he will call back if anything changes prior to follow up

## 2024-06-26 NOTE — Progress Notes (Signed)
 "   Assessment and Plan   1. Annual physical exam      2. Controlled type 2 diabetes mellitus without complication, without long-term current use of insulin  (*)  POCT A1C   Lipid Panel With LDL/HDL Ratio   Lipid Panel With LDL/HDL Ratio   POCT A1C    3. Bariatric surgery status  Vitamin B12   Vitamin D  25 Hydroxy   Vitamin D  25 Hydroxy   Vitamin B12    4. Seasonal allergies      5. Iron deficiency anemia secondary to inadequate dietary iron intake  Fe+TIBC+Fer   Fe+TIBC+Fer    6. Anxiety, generalized      7. Moderate episode of recurrent major depressive disorder (*)      8. Obstructive sleep apnea syndrome, mild      9. Hyponatremia  Ambulatory Referral to CPP Medication Management   Comprehensive Metabolic Panel   Comprehensive Metabolic Panel    10. Encounter for screening mammogram for malignant neoplasm of breast  Mammo 3D Tomo Screening Bilateral    11. Attention deficit hyperactivity disorder (ADHD), combined type       Assessment & Plan  1.  Wellness - see orders placed for this visit. The patient is asked to make an attempt to improve diet and exercise patterns to aid in health maintenance.  Immunizations have been reviewed and updated as noted.  Recommend repeat health maintenance exam in 1 year(s).  See patient instructions for further information.  Advised to set up mammogram.  Up-to-date on Pap smear and colon cancer screening  This is just outside the scope of the physical:  Type 2 diabetes last A1c not controlled in 8% range continue to work on cutting down on sugar and carbs continue with metformin  and Jardiance  and will check an A1c today foot exam done today as well be sure to schedule diabetic eye exam.  Continue working with counselor sugar addiction issues.  Unable to tolerate GLP-1's.  Had hypoglycemia with glipizide.  Consider adding on Januvia if A1c not to goal.  Recheck lipids well-controlled a few months ago  Hyponatremia ongoing SIADH workup  recently essentially negative she has initial consult with endocrinology Dr. Beryl December 23.  At this point she has been drinking water as well as electrolyte drinks with not much improvement taking sodium chloride  tablets.  Need to consider cutting back on specific psychiatric medications that can cause hyponatremia and she would like to discuss this with her psychiatrist.  Referral to [facility] tang clinical pharmacist for med review  Nausea chronic greater than 6 months possibly related to hyponatremia she does have Zofran  as needed  History of gastric surgery check B12 vitamin D  iron levels she continues to take iron  Chronic constipation stable with Linzess  GERD stable with omeprazole and Pepcid  Seasonal allergies stable with Singulair  and Xyzal and azelastine  nasal spray  Anxiety depression ADHD followed by Grayce Pander but has not been seen in 6 months reports she has appointment in January she will also call her psychiatrist about hyponatremia ongoing and she is on several medications that could cause low sodium currently on Strattera, bupropion , BuSpar , Lamictal , Seroquel , Trintellix .  Discussed cutting back on dosages or altogether to help improve the hyponatremia  Mammogram due advised to schedule with [facility] breast [facility] she says she will do so     Patient's Medications       * Accurate as of June 26, 2024  8:44 AM. Reflects encounter med changes as of  last refresh          Continued Medications      Instructions  acetaminophen  500 mg tablet Commonly known as: TYLENOL   1,000 mg, Every 6 hours as needed   APLENZIN  522 MG Tb24 Generic drug: buPROPion  HBr ER  522 mg, Daily   aspirin  EC tablet Commonly known as: ECOTRIN LOW DOSE  81 mg, Daily   atomoxetine 40 mg capsule Commonly known as: STRATTERA  40 mg, Daily   azelastine  0.1 % nasal spray Commonly known as: ASTELIN   2 sprays, 2 times a day   busPIRone  10 mg tablet Commonly known as: BUSPAR   10  mg, 3 times a day   CALCIUM  PO  250 mg, 2 times a day   empagliflozin  10 mg Tabs tablet Commonly known as: JARDIANCE   10 mg, Oral, Daily   famotidine 40 mg tablet Commonly known as: PEPCID  40 mg, Oral, Every evening   ferrous sulfate 325 (65 FE) MG tablet  325 mg, With breakfast   fish oil 1000 mg Caps Commonly known as: SEA-OMEGA  1 capsule   fluconazole  150 mg tablet Commonly known as: DIFLUCAN   Take 1 tablet once for yeast infection; may repeat once in 3 days if needed   gabapentin 300 mg capsule Commonly known as: NEURONTIN  3 times a day   lamoTRIgine  200 MG tablet Commonly known as: LAMICTAL   200 mg, 2 times a day   levocetirizine 5 mg tablet Commonly known as: XYZAL  5 mg, Oral, Every evening   linaclotide 145 mcg capsule Commonly known as: LINZESS  145 mcg, Oral, 30 minutes before breakfast   metFORMIN  ER 500 mg 24 hr tablet Commonly known as: GLUCOPHAGE -XR  TAKE 2 TABLETS ONE HOUR BEFORE BREAKFAST AND ONE HOUR BEFORE SUPPER   montelukast  10 MG tablet Commonly known as: SINGULAIR   10 mg, Oral, Daily   omeprazole 40 mg capsule Commonly known as: PRILOSEC  40 mg, Oral, Daily   ondansetron  8 mg disintegrating tablet Commonly known as: ZOFRAN -ODT  PLACE 1 TABLET ON THE TONGUE EVERY 12 HOURS AS NEEDED FOR NAUSEA   QUEtiapine  fumarate 100 mg tablet Commonly known as: SEROQUEL   Take by mouth.   sodium chloride  1 g tablet  1 g, Oral, 3 times a day   TRINTELLIX  10 MG tablet Generic drug: vortioxetine   10 mg, Daily       Orders Placed This Encounter  Procedures   Mammo 3D Tomo Screening Bilateral   Comprehensive Metabolic Panel   Lipid Panel With LDL/HDL Ratio   Fe+TIBC+Fer   Vitamin B12   Vitamin D  25 Hydroxy   Ambulatory Referral to CPP Medication Management   POCT A1C    Risks, benefits, and alternatives of the medications and treatment plan prescribed today were discussed, and patient expressed understanding. Plan follow-up  as discussed or as needed if any worsening symptoms or change in condition.       Subjective   Patient ID:  Jessica Bridges is a 58 y.o. (DOB 05/24/1966) female.     Patient presents with   3 month follow up    Kate Norris Figley presents today for her annual physical exam.  Lives with her husband continues to work full-time works from home.  Her son is healthy and lives in [place].  She does not smoke or drink alcohol.  She is trying to eat healthy and exercise regularly.  Additional complaints beyond the scope of the Wellness Exam include:  Follow-up for chronic disease state  management including type 2 diabetes history of bariatric surgery seasonal allergies iron deficiency anxiety depression ADHD obstructive sleep apnea mild.  Ongoing issue with hyponatremia she does have symptoms of nausea brain fog and difficulty concentrating, and fatigue.  Full workup recently for SIADH essentially negative and she does have initial consult scheduled with Dr. Beryl December 23 she has not spoken to her psychiatrist Dr. Grayce Pander yet about this even though we have been discussing this since essentially summer with ongoing low sodium levels possibly being from psychiatric medications.  She does see a counselor regularly.  She has been trying to drink plenty of water and at least 1 electrolyte drink per day.  She does not really like the taste of them.  She is taking the sodium chloride  tablets I have prescribed her 3 times daily consistently  History of Present Illness   Results  Allergies[1]  Medications Taking[2]  Past Medical History:  Diagnosis Date   ADHD (attention deficit hyperactivity disorder)    Allergy    Anemia    Anxiety    Cataracts, bilateral 07/14/20   I have a consultation for cataract   Diabetes mellitus (*)    GERD (gastroesophageal reflux disease)    Hyperlipidemia    Myocardial infarction (*) 12/23/21   SIADH (syndrome of inappropriate ADH production)  (*) 04/25/2017   Sleep apnea    Ulcer 2017   Past Surgical History:  Procedure Laterality Date   Breast surgery     Cholecystectomy     Colonoscopy  09/06/2023   Gastric bypass     Small intestine surgery     Ruin Y Gastric Bapass   Spine surgery  07/05/22   Upper gastrointestinal endoscopy  10/21/2023   Social History[3]  Family History[4] Immunization History  Administered Date(s) Administered   Influenza Quad 0.66ml single-dose vial(Fluzone) 03/12/2016   Influenza virus vaccine, unspecified formulation 06/27/2000, 05/29/2001, 05/19/2002   Influenza, MDCK, trivalent, PF (Flucelvax PF) 04/04/2023, 03/26/2024   Influenza, injectable, MDCK, preservative free, quadrivalent(FlucelvaxPF) 04/14/2021, 04/05/2022   MMR 10/16/1991, 06/17/2000   Meningococcal ACWY vaccine, unspecified formulation 03/15/1986   POLIO, UNSPECIFIED FORMULATION 03/25/1986, 04/23/1986   Pneumococcal Conjugate 13 (Prevnar) 03/01/2016, 03/13/2016   Pneumococcal Conjugate 20 (Prevnar) 12/18/2023   Pneumococcal Polysaccharide 02/18/2001   Pneumococcal Polysaccharide (Pneumovax) 03/08/2017   Td(adult) unspecified formulation 03/25/1986, 04/20/1986, 06/18/2000   Tdap 05/20/2014, 05/20/2018   Zoster Recombinant (Shingrix) 05/20/2018, 09/18/2018    Review of Systems  Constitutional:  Positive for fatigue. Negative for activity change, appetite change, chills, diaphoresis, fever and unexpected weight change.  HENT:  Negative for congestion, ear pain, hearing loss, mouth sores, postnasal drip, rhinorrhea, sinus pressure, sneezing, sore throat, tinnitus, trouble swallowing and voice change.   Eyes:  Negative for visual disturbance.  Respiratory:  Negative for cough, chest tightness, shortness of breath and wheezing.   Cardiovascular:  Negative for chest pain, palpitations and leg swelling.  Gastrointestinal:  Positive for nausea. Negative for abdominal distention, abdominal pain, blood in stool,  constipation, diarrhea and vomiting.  Endocrine: Negative for cold intolerance, heat intolerance, polydipsia, polyphagia and polyuria.  Genitourinary:  Negative for dysuria, frequency and urgency.  Musculoskeletal:  Negative for arthralgias, back pain, gait problem, myalgias, neck pain and neck stiffness.  Skin:  Negative for color change, rash and wound.  Neurological:  Negative for dizziness, tremors, weakness, light-headedness, numbness and headaches.  Hematological:  Negative for adenopathy. Does not bruise/bleed easily.  Psychiatric/Behavioral:  Positive for decreased concentration. Negative for agitation, confusion, dysphoric mood and sleep  disturbance. The patient is not nervous/anxious.      Objective   Vitals:   06/26/24 0755  BP: 130/70  Pulse: 97  Resp: 18  Temp: 97.6 F (36.4 C)  SpO2: 97%   Physical Exam  Physical Exam Vitals and nursing note reviewed.  Constitutional:      General: She is not in acute distress.    Appearance: She is well-developed. She is not diaphoretic.  HENT:     Head: Normocephalic and atraumatic.     Right Ear: External ear normal.     Left Ear: External ear normal.     Nose: Nose normal.     Mouth/Throat:     Pharynx: No oropharyngeal exudate.  Eyes:     General:        Right eye: No discharge.        Left eye: No discharge.     Conjunctiva/sclera: Conjunctivae normal.     Pupils: Pupils are equal, round, and reactive to light.  Neck:     Thyroid: No thyromegaly.     Vascular: No carotid bruit or JVD.     Trachea: No tracheal deviation.  Cardiovascular:     Rate and Rhythm: Normal rate and regular rhythm.     Heart sounds: Normal heart sounds. No murmur heard.    No friction rub. No gallop.  Musculoskeletal:        General: Normal range of motion.     Cervical back: Normal range of motion and neck supple.  Pulmonary:     Effort: Pulmonary effort is normal. No respiratory distress.     Breath sounds: Normal breath sounds. No  wheezing or rales.  Abdominal:     General: Bowel sounds are normal. There is no distension.     Palpations: Abdomen is soft. There is no mass.     Tenderness: There is no abdominal tenderness. There is no guarding or rebound.  Lymphadenopathy:     Cervical: No cervical adenopathy.  Skin:    General: Skin is warm and dry.     Findings: No erythema or rash.  Neurological:     Mental Status: She is alert and oriented to person, place, and time.     Cranial Nerves: No cranial nerve deficit.     Motor: No abnormal muscle tone.     Coordination: Coordination normal.     Deep Tendon Reflexes: Reflexes are normal and symmetric. Reflexes normal.  Psychiatric:        Mood and Affect: Mood normal.     Recent Results (from the past 2 weeks)  Osmolality   Collection Time: 06/19/24  8:04 AM  Result Value Ref Range   Osmolality 287 275 - 295 mOsmol/kg  CBC And Differential   Collection Time: 06/19/24  3:40 PM  Result Value Ref Range   WBC 7.3 3.7 - 11.0 thou/mcL   RBC 4.01 4.01 - 4.90 million/mcL   HGB 11.9 (L) 12.2 - 14.9 gm/dL   HCT 64.0 64.1 - 52.0 %   MCV 89.5 82.0 - 98.0 fL   MCH 29.7 27.0 - 33.0 pg   MCHC 33.2 31.0 - 37.0 gm/dL   Plt Ct 681 849 - 599 thou/mcL   RDW CV 13.9 11.8 - 14.9 %   NEUTROPHIL % 61.8 %   LYMPHOCYTE % 32.3 %   MID% 5.9 %   ABSOLUTE NEUTROPHIL COUNT 4.50 1.50 - 7.50 thou/mcL   ABSOLUTE LYMPHOCYTE COUNT 2.30 1.00 - 4.50 thou/mcL   ABSOLUTE MID 0.5  0.1 - 1.5 thou/mcL    Portions of the history and exam were entered using voice recognition software.  Minor syntax and spelling errors may be related to the use of this software and were not intentional.  Attestation         [1] Allergies Allergen Reactions   Atorvastatin  Other   Fluvastatin Other   Simvastatin Other  [2] Outpatient Medications Marked as Taking for the 06/26/24 encounter (Office Visit) with Comer Baird GAILS, NP  Medication Sig Dispense Refill   acetaminophen  (TYLENOL ) 500  mg tablet Take two tablets (1,000 mg dose) by mouth every 6 (six) hours as needed.     aspirin  (ECOTRIN LOW DOSE) EC tablet Take one tablet (81 mg dose) by mouth daily.     atomoxetine (STRATTERA) 40 mg capsule Take one capsule (40 mg dose) by mouth daily.     azelastine  (ASTELIN ) 0.1 % nasal spray USE 2 SPRAYS NASALLY TWICE A DAY 30 mL 13   buPROPion  HBr ER (APLENZIN ) 522 MG TB24 Take one tablet (522 mg dose) by mouth daily.     busPIRone  (BUSPAR ) 10 mg tablet Take one tablet (10 mg dose) by mouth 3 (three) times a day.     CALCIUM  PO Take 250 mg by mouth 2 (two) times daily.     empagliflozin  (JARDIANCE ) 10 mg TABS tablet Take one tablet (10 mg dose) by mouth daily. 90 tablet 1   famotidine (PEPCID) 40 mg tablet Take one tablet (40 mg dose) by mouth every evening. 90 tablet 1   ferrous sulfate 325 (65 FE) MG tablet Take one tablet (325 mg dose) by mouth with breakfast.     fish oil (SEA-OMEGA) 1000 mg CAPS one capsule (1,000 mg dose).     fluconazole  (DIFLUCAN ) 150 mg tablet Take 1 tablet once for yeast infection; may repeat once in 3 days if needed 2 tablet 5   gabapentin (NEURONTIN) 300 mg capsule Take by mouth 3 (three) times a day.     lamoTRIgine  (LAMICTAL ) 200 MG tablet Take one tablet (200 mg dose) by mouth 2 (two) times daily.     levocetirizine (XYZAL) 5 mg tablet TAKE 1 TABLET EVERY EVENING 90 tablet 3   linaclotide (LINZESS) 145 mcg capsule Take one capsule (145 mcg dose) by mouth 30 (thirty) minutes before breakfast. 30 capsule 3   metFORMIN  ER (GLUCOPHAGE -XR) 500 mg 24 hr tablet TAKE 2 TABLETS ONE HOUR BEFORE BREAKFAST AND ONE HOUR BEFORE SUPPER 360 tablet 0   montelukast  (SINGULAIR ) 10 MG tablet TAKE 1 TABLET DAILY 90 tablet 3   omeprazole (PRILOSEC) 40 mg capsule TAKE 1 CAPSULE DAILY 90 capsule 3   ondansetron  (ZOFRAN -ODT) 8 mg disintegrating tablet PLACE 1 TABLET ON THE TONGUE EVERY 12 HOURS AS NEEDED FOR NAUSEA 180 tablet 3   QUEtiapine  fumarate (SEROQUEL )  100 mg tablet Take by mouth.     sodium chloride  1 g tablet Take one tablet (1 g dose) by mouth 3 (three) times a day. 270 tablet 1   TRINTELLIX  10 MG tablet Take one tablet (10 mg dose) by mouth daily.    [3] Social History Socioeconomic History   Marital status: Married    Spouse name: Desiderio Follmer  Occupational History   Occupation: Airline Pilot  Tobacco Use   Smoking status: Never    Passive exposure: Past   Smokeless tobacco: Never  Vaping Use   Vaping status: Never Used  Substance and Sexual Activity   Alcohol use: Yes    Comment: Occasionally  Drug use: No   Sexual activity: Yes    Partners: Male  [4] Family History Problem Relation Name Age of Onset   Alcohol abuse Paternal Grandfather Aureliano    Arrhythmia Mother Cathlean    Arthritis Father Mitchell     Paternal Jeannine Lye    Cancer Father Mitchell         skin   Colon cancer Neg Hx     Colon polyps Neg Hx     Depression Brother Bret    Diabetes Brother Bret     Father Mitchell    Early death Mother Cathlean    Heart attack Brother Bret    Heart disease Brother Bret     Father Mitchell     Paternal Grandfather Aureliano     Paternal Grandmother Mildred    Hypertension Brother Bret     Father Mitchell     Paternal Grandmother Lye    Seizures Mother Cathlean    Stroke Father Mitchell   *Some images could not be shown."

## 2024-07-03 ENCOUNTER — Ambulatory Visit (HOSPITAL_BASED_OUTPATIENT_CLINIC_OR_DEPARTMENT_OTHER): Admitting: Cardiology

## 2024-07-03 ENCOUNTER — Encounter (HOSPITAL_BASED_OUTPATIENT_CLINIC_OR_DEPARTMENT_OTHER): Payer: Self-pay | Admitting: Cardiology

## 2024-07-03 VITALS — BP 122/70 | HR 97 | Ht 66.0 in | Wt 159.4 lb

## 2024-07-03 DIAGNOSIS — I2510 Atherosclerotic heart disease of native coronary artery without angina pectoris: Secondary | ICD-10-CM

## 2024-07-03 DIAGNOSIS — Z7189 Other specified counseling: Secondary | ICD-10-CM | POA: Diagnosis not present

## 2024-07-03 DIAGNOSIS — E119 Type 2 diabetes mellitus without complications: Secondary | ICD-10-CM | POA: Diagnosis not present

## 2024-07-03 DIAGNOSIS — Z8679 Personal history of other diseases of the circulatory system: Secondary | ICD-10-CM

## 2024-07-03 NOTE — Progress Notes (Signed)
 " Cardiology Office Note:  .   Date:  07/03/2024  ID:  KERA DEACON, DOB [birth year], MRN [id number] PCP: Baird Comer GAILS, NP  [facility] Providers Cardiologist:  Shelda Bruckner, MD {  History of Present Illness: .   Jessica Bridges is a 58 y.o. female with a hx of Takotsubo syndrome with recovered EF, type II diabetes, history of gastric bypass who is seen for follow up. She was initially followed by Dr. Court and established care with me on 05/18/22.   CV history: 12/23/21 she presented with NSTEMI.  She had a widely patent left main and patent LAD with mild nonobstructive mid vessel plaque, angiographically normal left circumflex and normal LVEDP (cardiac cath 12/24/2021). Diagnosed with Takotsubo syndrome.  Today: Overall doing well. Working, doing ok with this, struggles some with memory/brain fog. Depression is well controlled. Able to walk at a brisk pace without limitations. Was walking daily but with weather change has been walking less. Discussed exercise recommendations.   Has some severe heartburn episodes, now resolved.   Low sodium had been a struggled for over a year, now improved. Taking salt pills, blood pressure stable.   Discussed cholesterol recommendations.   Asking about arrhythmias, have family members with this, discussed.   Asking about disability, concerned about future risk. Discussed recommendations today.   ROS: Denies other chest pain, shortness of breath at rest or with normal exertion. No PND, orthopnea, LE edema. No syncope or palpitations. ROS otherwise negative except as noted.   Studies Reviewed: SABRA    EKG:       Physical Exam:   VS:  BP 122/70 (BP Location: Left Arm, Patient Position: Sitting;Standing, Cuff Size: Normal)   Pulse 97   Ht 5' 6 (1.676 m)   Wt 159 lb 6.4 oz (72.3 kg)   SpO2 97%   BMI 25.73 kg/m    Wt Readings from Last 3 Encounters:  07/03/24 159 lb 6.4 oz (72.3 kg)  09/06/23 140 lb (63.5 kg)  07/30/23 140 lb (63.5  kg)    GEN: Well nourished, well developed in no acute distress HEENT: Normal, moist mucous membranes NECK: No JVD CARDIAC: regular rhythm, normal S1 and S2, no rubs or gallops. No murmur. VASCULAR: Radial and DP pulses 2+ bilaterally. No carotid bruits RESPIRATORY:  Clear to auscultation without rales, wheezing or rhonchi  ABDOMEN: Soft, non-tender, non-distended MUSCULOSKELETAL:  Ambulates independently SKIN: Warm and dry, no edema NEUROLOGIC:  Alert and oriented x 3. No focal neuro deficits noted. PSYCHIATRIC:  Normal affect    ASSESSMENT AND PLAN: .    Nonischemic cardiomyopathy, likely stress induced -did not tolerate metoprolol  due to fatigue. Discussed other meds for GDMT, declined. Now on SGLT2i.  -repeat echo 07/2022 shows normalization of EF to 60-65%   Type II diabetes Nonobstructive CAD as cath -with NICM, we had discussed SGLT2i and GLP. Was on tirzepatide , stopped due to GI symptoms. On SGLT2i now -previously on aspirin  and rosuvastatin , cath with nonobstructive disease. I recommend continuing aspirin  and cholesterol medication given diabetes, nonobstructive CAD. She had flu like symptoms on statins, does not wish to retrial these (simvastatin, atorvastatin , rosuvastatin , fluvastatin). Did discuss ezetimibe , nexletol, [id number] today.   CV risk counseling and prevention -recommend heart healthy/Mediterranean diet, with whole grains, fruits, vegetable, fish, lean meats, nuts, and olive oil. Limit salt. -recommend moderate walking, 3-5 times/week for 30-50 minutes each session. Aim for at least 150 minutes/week. Goal should be pace of 3 miles/hours, or walking 1.5 miles in  30 minutes -recommend avoidance of tobacco products. Avoid excess alcohol.  Dispo: 1 year or sooner as needed  Signed, Shelda Bruckner, MD   Shelda Bruckner, MD, PhD, [facility]  [facility]  [facility] [address] [place], [place] [address] [phone]   "

## 2024-07-03 NOTE — Patient Instructions (Addendum)
 When applying for Social Security Disability benefits, the SSA will compare your condition to a listing of conditions known as the Blue Book. Each condition that could potentially qualify an individual for disability benefits is listed in this Blue Book, along with the criteria that must be met to qualify with each specific condition.  Chronic heart failure is addressed in Section 4.02 of the [facility] Book. According to the [facility] Book, in order to qualify for Social Security Disability benefits due to heart failure, you must be able to prove that:  You have been diagnosed with chronic heart failure while undergoing prescribed treatment;  and there is medically documented evidence of systolic failure with left ventricular end diastolic dimensions greater than 6.0 cm or ejection fraction of 30 percent or less during a period of stability; or diastolic failure with left ventricular posterior wall plus septal thickness totaling 2.5 cm or greater on imaging with an enlarged left atrium greater than or equal to 4.5 cm with normal or elevated ejection fraction during a period of stability   and Persistent symptoms of heart failure are present that seriously limit the ability to independently initiate, sustain, or complete activities of daily living; or there are three or more separate documented episodes of acute congestive heart failure within a 22-month period  or you suffer an inability to perform on an exercise tolerance test at a workload equivalent to or less.  For more specific information, visit: [email or web address]   Thankfully, you do not have chronic heart failure as your function recovered after your event. We are recommended to not provide disability recommendations unless there is a current active condition (like heart failure above) that limits the ability to work.     Medication Instructions:  No changes *If you need a  refill on your cardiac medications before your next appointment, please call your pharmacy*  Lab Work: none If you have labs (blood work) drawn today and your tests are completely normal, you will receive your results only by: MyChart Message (if you have MyChart) OR A paper copy in the mail If you have any lab test that is abnormal or we need to change your treatment, we will call you to review the results.  Testing/Procedures: none  Follow-Up: At [facility], you and your health needs are our priority.  As part of our continuing mission to provide you with exceptional heart care, our providers are all part of one team.  This team includes your primary Cardiologist (physician) and Advanced Practice Providers or APPs (Physician Assistants and Nurse Practitioners) who all work together to provide you with the care you need, when you need it.  Your next appointment:   12 month(s)  Provider:   Shelda Bruckner, MD, Rosaline Bane, NP, or Reche Finder, NP
